# Patient Record
Sex: Female | Born: 1955 | Race: White | Hispanic: No | Marital: Married | Smoking: Former smoker
Health system: Southern US, Community
[De-identification: ages and names within clinical notes are randomized; demographics above are authoritative.]

## PROBLEM LIST (undated history)

## (undated) DIAGNOSIS — F419 Anxiety disorder, unspecified: Secondary | ICD-10-CM

## (undated) DIAGNOSIS — E559 Vitamin D deficiency, unspecified: Secondary | ICD-10-CM

## (undated) DIAGNOSIS — G4730 Sleep apnea, unspecified: Secondary | ICD-10-CM

## (undated) DIAGNOSIS — R002 Palpitations: Secondary | ICD-10-CM

## (undated) DIAGNOSIS — M2550 Pain in unspecified joint: Secondary | ICD-10-CM

## (undated) DIAGNOSIS — K829 Disease of gallbladder, unspecified: Secondary | ICD-10-CM

## (undated) DIAGNOSIS — E785 Hyperlipidemia, unspecified: Secondary | ICD-10-CM

## (undated) DIAGNOSIS — K219 Gastro-esophageal reflux disease without esophagitis: Secondary | ICD-10-CM

## (undated) DIAGNOSIS — Z923 Personal history of irradiation: Secondary | ICD-10-CM

## (undated) DIAGNOSIS — C50919 Malignant neoplasm of unspecified site of unspecified female breast: Secondary | ICD-10-CM

## (undated) DIAGNOSIS — Z8739 Personal history of other diseases of the musculoskeletal system and connective tissue: Secondary | ICD-10-CM

## (undated) DIAGNOSIS — M549 Dorsalgia, unspecified: Secondary | ICD-10-CM

## (undated) DIAGNOSIS — F32A Depression, unspecified: Secondary | ICD-10-CM

## (undated) DIAGNOSIS — M1990 Unspecified osteoarthritis, unspecified site: Secondary | ICD-10-CM

## (undated) HISTORY — DX: Palpitations: R00.2

## (undated) HISTORY — DX: Sleep apnea, unspecified: G47.30

## (undated) HISTORY — PX: REDUCTION MAMMAPLASTY: SUR839

## (undated) HISTORY — DX: Unspecified osteoarthritis, unspecified site: M19.90

## (undated) HISTORY — DX: Vitamin D deficiency, unspecified: E55.9

## (undated) HISTORY — DX: Pain in unspecified joint: M25.50

## (undated) HISTORY — DX: Personal history of other diseases of the musculoskeletal system and connective tissue: Z87.39

## (undated) HISTORY — DX: Disease of gallbladder, unspecified: K82.9

## (undated) HISTORY — DX: Dorsalgia, unspecified: M54.9

## (undated) HISTORY — PX: BREAST BIOPSY: SHX20

## (undated) HISTORY — DX: Anxiety disorder, unspecified: F41.9

## (undated) HISTORY — DX: Hyperlipidemia, unspecified: E78.5

## (undated) HISTORY — DX: Gastro-esophageal reflux disease without esophagitis: K21.9

## (undated) HISTORY — DX: Depression, unspecified: F32.A

---

## 2002-04-21 DIAGNOSIS — C50919 Malignant neoplasm of unspecified site of unspecified female breast: Secondary | ICD-10-CM

## 2002-04-21 HISTORY — DX: Malignant neoplasm of unspecified site of unspecified female breast: C50.919

## 2002-04-21 HISTORY — PX: BREAST EXCISIONAL BIOPSY: SUR124

## 2004-12-17 ENCOUNTER — Ambulatory Visit: Payer: Self-pay | Admitting: Oncology

## 2004-12-18 ENCOUNTER — Ambulatory Visit: Payer: Self-pay | Admitting: Obstetrics and Gynecology

## 2004-12-20 ENCOUNTER — Ambulatory Visit: Payer: Self-pay | Admitting: Oncology

## 2005-06-13 ENCOUNTER — Ambulatory Visit: Payer: Self-pay | Admitting: Oncology

## 2005-06-19 ENCOUNTER — Ambulatory Visit: Payer: Self-pay | Admitting: Oncology

## 2006-01-02 ENCOUNTER — Ambulatory Visit: Payer: Self-pay | Admitting: Oncology

## 2006-01-19 ENCOUNTER — Ambulatory Visit: Payer: Self-pay | Admitting: Oncology

## 2006-02-19 ENCOUNTER — Ambulatory Visit: Payer: Self-pay | Admitting: Oncology

## 2006-07-01 ENCOUNTER — Ambulatory Visit: Payer: Self-pay | Admitting: Oncology

## 2006-07-21 ENCOUNTER — Ambulatory Visit: Payer: Self-pay | Admitting: Oncology

## 2006-07-29 ENCOUNTER — Ambulatory Visit: Payer: Self-pay | Admitting: Oncology

## 2006-08-20 ENCOUNTER — Ambulatory Visit: Payer: Self-pay | Admitting: Oncology

## 2007-01-20 ENCOUNTER — Ambulatory Visit: Payer: Self-pay | Admitting: Oncology

## 2007-02-04 ENCOUNTER — Ambulatory Visit: Payer: Self-pay | Admitting: Oncology

## 2007-02-20 ENCOUNTER — Ambulatory Visit: Payer: Self-pay | Admitting: Oncology

## 2007-07-21 ENCOUNTER — Ambulatory Visit: Payer: Self-pay | Admitting: Oncology

## 2007-08-03 ENCOUNTER — Ambulatory Visit: Payer: Self-pay | Admitting: Oncology

## 2007-08-09 ENCOUNTER — Ambulatory Visit: Payer: Self-pay | Admitting: Oncology

## 2007-08-20 ENCOUNTER — Ambulatory Visit: Payer: Self-pay | Admitting: Oncology

## 2008-01-20 ENCOUNTER — Ambulatory Visit: Payer: Self-pay | Admitting: Oncology

## 2008-01-25 ENCOUNTER — Ambulatory Visit: Payer: Self-pay | Admitting: Oncology

## 2008-02-20 ENCOUNTER — Ambulatory Visit: Payer: Self-pay | Admitting: Oncology

## 2008-07-20 ENCOUNTER — Ambulatory Visit: Payer: Self-pay | Admitting: Oncology

## 2008-08-08 ENCOUNTER — Ambulatory Visit: Payer: Self-pay | Admitting: Oncology

## 2008-08-09 ENCOUNTER — Ambulatory Visit: Payer: Self-pay | Admitting: Oncology

## 2008-08-19 ENCOUNTER — Ambulatory Visit: Payer: Self-pay | Admitting: Oncology

## 2009-08-01 DIAGNOSIS — C4491 Basal cell carcinoma of skin, unspecified: Secondary | ICD-10-CM

## 2009-08-01 HISTORY — DX: Basal cell carcinoma of skin, unspecified: C44.91

## 2009-08-13 ENCOUNTER — Emergency Department: Payer: Self-pay | Admitting: Emergency Medicine

## 2009-09-19 ENCOUNTER — Ambulatory Visit: Payer: Self-pay | Admitting: Oncology

## 2009-09-24 ENCOUNTER — Ambulatory Visit: Payer: Self-pay | Admitting: Oncology

## 2009-10-01 ENCOUNTER — Ambulatory Visit: Payer: Self-pay | Admitting: Oncology

## 2009-10-19 ENCOUNTER — Ambulatory Visit: Payer: Self-pay | Admitting: Oncology

## 2010-09-26 ENCOUNTER — Ambulatory Visit: Payer: Self-pay | Admitting: Oncology

## 2010-10-14 ENCOUNTER — Ambulatory Visit: Payer: Self-pay | Admitting: Oncology

## 2010-10-20 ENCOUNTER — Ambulatory Visit: Payer: Self-pay | Admitting: Oncology

## 2011-09-29 ENCOUNTER — Ambulatory Visit: Payer: Self-pay | Admitting: Oncology

## 2011-11-27 ENCOUNTER — Ambulatory Visit: Payer: Self-pay | Admitting: Oncology

## 2011-12-21 ENCOUNTER — Ambulatory Visit: Payer: Self-pay | Admitting: Oncology

## 2012-05-20 ENCOUNTER — Ambulatory Visit: Payer: Self-pay | Admitting: Gastroenterology

## 2012-05-26 ENCOUNTER — Ambulatory Visit: Payer: Self-pay

## 2012-06-23 ENCOUNTER — Ambulatory Visit: Payer: Self-pay | Admitting: Surgery

## 2012-06-23 LAB — CBC WITH DIFFERENTIAL/PLATELET
Basophil %: 0.8 %
HCT: 40.7 % (ref 35.0–47.0)
Lymphocyte %: 35.7 %
MCH: 31.6 pg (ref 26.0–34.0)
MCV: 95 fL (ref 80–100)
Monocyte #: 0.5 x10 3/mm (ref 0.2–0.9)
Monocyte %: 8.2 %
Neutrophil %: 53.5 %
Platelet: 231 10*3/uL (ref 150–440)
RBC: 4.28 10*6/uL (ref 3.80–5.20)
WBC: 5.7 10*3/uL (ref 3.6–11.0)

## 2012-06-23 LAB — HEPATIC FUNCTION PANEL A (ARMC)
Albumin: 3.6 g/dL (ref 3.4–5.0)
Alkaline Phosphatase: 86 U/L (ref 50–136)
Bilirubin,Total: 0.4 mg/dL (ref 0.2–1.0)
Total Protein: 7.2 g/dL (ref 6.4–8.2)

## 2012-06-30 ENCOUNTER — Ambulatory Visit: Payer: Self-pay | Admitting: Surgery

## 2012-09-29 ENCOUNTER — Ambulatory Visit: Payer: Self-pay | Admitting: Oncology

## 2012-11-19 ENCOUNTER — Ambulatory Visit: Payer: Self-pay | Admitting: Oncology

## 2012-12-20 ENCOUNTER — Ambulatory Visit: Payer: Self-pay | Admitting: Oncology

## 2013-10-13 ENCOUNTER — Ambulatory Visit: Payer: Self-pay | Admitting: Oncology

## 2013-12-07 DIAGNOSIS — M3390 Dermatopolymyositis, unspecified, organ involvement unspecified: Secondary | ICD-10-CM | POA: Insufficient documentation

## 2014-08-04 ENCOUNTER — Other Ambulatory Visit: Payer: Self-pay | Admitting: Certified Nurse Midwife

## 2014-08-04 DIAGNOSIS — Z1231 Encounter for screening mammogram for malignant neoplasm of breast: Secondary | ICD-10-CM

## 2014-08-11 NOTE — Op Note (Signed)
PATIENT NAME:  Ann Jordan, KOFFLER MR#:  [id number] DATE OF BIRTH:  August 06, 1955  DATE OF PROCEDURE:  06/30/2012  PREOPERATIVE DIAGNOSIS: Symptomatic cholelithiasis.   POSTOPERATIVE DIAGNOSIS: Symptomatic cholelithiasis.   PROCEDURE PERFORMED: Laparoscopic cholecystectomy.   ATTENDING SURGEON: Hortencia Conradi, MD. FACS  ASSISTANT: Surgical scrub technologist.   TYPE OF ANESTHESIA: General oral endotracheal.   FINDINGS: Three large stones, one of which was given to the patient. There was a large gallbladder with chronic inflammatory changes.   SPECIMENS: Gallbladder with contents.   ESTIMATED BLOOD LOSS: 50 mL.   DRAINS: None.   LAP AND NEEDLE COUNT: Correct x 2.   DESCRIPTION OF PROCEDURE: With the patient in the supine position, general oral endotracheal anesthesia was induced. Her abdomen was widely prepped and draped with ChloraPrep solution. The left arm was padded and tucked at her side. Timeout was observed.   A 12 mm blunt Hassan trocar was placed through an open technique through an infraumbilical transversely oriented skin incision with stay sutures being passed through the fascia. Pneumoperitoneum was established. The patient was then positioned in reverse Trendelenburg and airplaned right side up. A 5 mm bladeless trocar was placed in the epigastric region. Two 5 mm ports were then placed in the right subcostal margin. The gallbladder was grasped along its fundus and elevated over the right lobe of the liver. Lateral traction was placed on Hartman's pouch. Adhesions of the omentum were taken down with blunt technique and point cautery for hemostasis. Lateral traction was placed on Hartman's pouch. The hepatoduodenal ligament was then exposed incising the peritoneum overlying the structures. Cystic duct and cystic artery were critically identified. A thickened peritoneal band anterior to the cystic duct was divided between single hemoclips. With a critical view of safety cholecystectomy  being performed, the cystic duct was triply clipped on the portal side, singly clipped on the gallbladder side and divided.   Of note, the cystic artery appeared to be in intimate association with the cystic duct located on its anterior surface and these 2 were able to be separated with ease. The cystic artery was triply clipped on the portal side, singly clipped on the gallbladder side and divided. Further dissection within this space demonstrated no evidence of an aberrant artery or bile duct. The gallbladder was then taken off the liver bed utilizing a combination of traction, countertraction and hook electrocautery device. It was placed into an Endo Catch device and retrieved. Due to the large nature of the stones, which could not be crushed, the infraumbilical fascial defect was extended approximately 1.5 cm inferiorly. The skin incision was made slightly wider. The specimen was able to be delivered. With pneumoperitoneum re-established, the right upper quadrant was inspected for hemostasis and irrigated with 1 L of normal saline irrigation. Hemostasis was obtained with point cautery. Surgiflo (10 mL) with thrombin application was applied to the gallbladder fossa. With hemostasis being ensured on the operative field, no evidence of bile leak or bowel injury, ports were then removed under direct visualization. The infraumbilical fascial defect was reapproximated with multiple interrupted simple and vertical #0 Vicryl sutures. Total of 30 mL of 0.25% plain Marcaine was infiltrated along all skin and fascial incisions prior to closure. The infraumbilical subcutaneous space was reapproximated with interrupted 3-0 Vicryl sutures. 4-0 Vicryl subcuticular was applied to the skin edges followed by the application of benzoin, Steri-Strips, Telfa, and Tegaderm. The patient was then subsequently extubated and taken to the recovery room in stable and satisfactory condition by anesthesia services.  ____________________________ Jeannette How Marina Gravel, MD FACS mab:aw D: 07/01/2012 10:50:58 ET T: 07/01/2012 11:02:22 ET JOB#: [id number]  cc: Elta Gu[place] A. Marina Gravel, MD, <Dictator> Adin Hector, MD MARK Bettina Gavia MD ELECTRONICALLY SIGNED 07/06/2012 14:21

## 2014-10-02 ENCOUNTER — Ambulatory Visit
Admission: RE | Admit: 2014-10-02 | Discharge: 2014-10-02 | Disposition: A | Discharge status: Home / Self Care | Payer: Managed Care, Other (non HMO) | Source: Ambulatory Visit | Attending: Certified Nurse Midwife | Admitting: Certified Nurse Midwife

## 2014-10-02 DIAGNOSIS — Z1231 Encounter for screening mammogram for malignant neoplasm of breast: Secondary | ICD-10-CM | POA: Insufficient documentation

## 2014-10-02 HISTORY — DX: Malignant neoplasm of unspecified site of unspecified female breast: C50.919

## 2015-02-15 DIAGNOSIS — R002 Palpitations: Secondary | ICD-10-CM | POA: Insufficient documentation

## 2015-05-02 ENCOUNTER — Other Ambulatory Visit: Payer: Self-pay | Admitting: Obstetrics and Gynecology

## 2015-05-02 DIAGNOSIS — Z1231 Encounter for screening mammogram for malignant neoplasm of breast: Secondary | ICD-10-CM

## 2015-08-20 DIAGNOSIS — Z853 Personal history of malignant neoplasm of breast: Secondary | ICD-10-CM | POA: Insufficient documentation

## 2015-10-03 ENCOUNTER — Ambulatory Visit
Admission: RE | Admit: 2015-10-03 | Discharge: 2015-10-03 | Disposition: A | Discharge status: Home / Self Care | Payer: Managed Care, Other (non HMO) | Source: Ambulatory Visit | Attending: Obstetrics and Gynecology | Admitting: Obstetrics and Gynecology

## 2015-10-03 ENCOUNTER — Other Ambulatory Visit: Payer: Self-pay | Admitting: Obstetrics and Gynecology

## 2015-10-03 DIAGNOSIS — Z1231 Encounter for screening mammogram for malignant neoplasm of breast: Secondary | ICD-10-CM | POA: Insufficient documentation

## 2016-02-20 DIAGNOSIS — F3342 Major depressive disorder, recurrent, in full remission: Secondary | ICD-10-CM | POA: Insufficient documentation

## 2016-05-06 ENCOUNTER — Other Ambulatory Visit: Payer: Self-pay | Admitting: Obstetrics and Gynecology

## 2016-05-06 DIAGNOSIS — Z1231 Encounter for screening mammogram for malignant neoplasm of breast: Secondary | ICD-10-CM

## 2016-10-06 ENCOUNTER — Ambulatory Visit: Payer: Managed Care, Other (non HMO)

## 2016-10-07 ENCOUNTER — Ambulatory Visit
Admission: RE | Admit: 2016-10-07 | Discharge: 2016-10-07 | Disposition: A | Discharge status: Home / Self Care | Payer: Managed Care, Other (non HMO) | Source: Ambulatory Visit | Attending: Obstetrics and Gynecology | Admitting: Obstetrics and Gynecology

## 2016-10-07 ENCOUNTER — Other Ambulatory Visit: Payer: Self-pay | Admitting: Obstetrics and Gynecology

## 2016-10-07 DIAGNOSIS — Z1231 Encounter for screening mammogram for malignant neoplasm of breast: Secondary | ICD-10-CM | POA: Diagnosis not present

## 2017-05-07 ENCOUNTER — Other Ambulatory Visit: Payer: Self-pay | Admitting: Obstetrics and Gynecology

## 2017-05-07 DIAGNOSIS — Z1231 Encounter for screening mammogram for malignant neoplasm of breast: Secondary | ICD-10-CM

## 2017-10-09 ENCOUNTER — Ambulatory Visit: Payer: Managed Care, Other (non HMO)

## 2017-10-23 ENCOUNTER — Ambulatory Visit
Admission: RE | Admit: 2017-10-23 | Discharge: 2017-10-23 | Disposition: A | Discharge status: Home / Self Care | Payer: Managed Care, Other (non HMO) | Source: Ambulatory Visit | Attending: Obstetrics and Gynecology | Admitting: Obstetrics and Gynecology

## 2017-10-23 DIAGNOSIS — Z1231 Encounter for screening mammogram for malignant neoplasm of breast: Secondary | ICD-10-CM

## 2018-03-05 DIAGNOSIS — H02886 Meibomian gland dysfunction of left eye, unspecified eyelid: Secondary | ICD-10-CM | POA: Insufficient documentation

## 2018-03-05 DIAGNOSIS — H2513 Age-related nuclear cataract, bilateral: Secondary | ICD-10-CM | POA: Insufficient documentation

## 2018-03-05 DIAGNOSIS — H02883 Meibomian gland dysfunction of right eye, unspecified eyelid: Secondary | ICD-10-CM | POA: Insufficient documentation

## 2018-05-20 ENCOUNTER — Other Ambulatory Visit: Payer: Self-pay | Admitting: Obstetrics and Gynecology

## 2018-05-20 DIAGNOSIS — Z1231 Encounter for screening mammogram for malignant neoplasm of breast: Secondary | ICD-10-CM

## 2018-08-27 DIAGNOSIS — E7849 Other hyperlipidemia: Secondary | ICD-10-CM | POA: Diagnosis not present

## 2018-08-27 DIAGNOSIS — M069 Rheumatoid arthritis, unspecified: Secondary | ICD-10-CM | POA: Diagnosis not present

## 2018-08-27 DIAGNOSIS — M3390 Dermatopolymyositis, unspecified, organ involvement unspecified: Secondary | ICD-10-CM | POA: Diagnosis not present

## 2018-09-03 DIAGNOSIS — E7849 Other hyperlipidemia: Secondary | ICD-10-CM | POA: Diagnosis not present

## 2018-09-03 DIAGNOSIS — R69 Illness, unspecified: Secondary | ICD-10-CM | POA: Diagnosis not present

## 2018-09-03 DIAGNOSIS — Z0000 Encounter for general adult medical examination without abnormal findings: Secondary | ICD-10-CM | POA: Diagnosis not present

## 2018-09-03 DIAGNOSIS — R002 Palpitations: Secondary | ICD-10-CM | POA: Diagnosis not present

## 2018-11-17 ENCOUNTER — Other Ambulatory Visit: Payer: Self-pay

## 2018-11-17 ENCOUNTER — Ambulatory Visit
Admission: RE | Admit: 2018-11-17 | Discharge: 2018-11-17 | Disposition: A | Discharge status: Home / Self Care | Payer: PPO | Source: Ambulatory Visit | Attending: Obstetrics and Gynecology | Admitting: Obstetrics and Gynecology

## 2018-11-17 DIAGNOSIS — Z1231 Encounter for screening mammogram for malignant neoplasm of breast: Secondary | ICD-10-CM | POA: Diagnosis not present

## 2019-03-04 DIAGNOSIS — Z791 Long term (current) use of non-steroidal anti-inflammatories (NSAID): Secondary | ICD-10-CM | POA: Diagnosis not present

## 2019-03-04 DIAGNOSIS — E7849 Other hyperlipidemia: Secondary | ICD-10-CM | POA: Diagnosis not present

## 2019-03-04 DIAGNOSIS — M3390 Dermatopolymyositis, unspecified, organ involvement unspecified: Secondary | ICD-10-CM | POA: Diagnosis not present

## 2019-03-04 DIAGNOSIS — M069 Rheumatoid arthritis, unspecified: Secondary | ICD-10-CM | POA: Diagnosis not present

## 2019-03-11 DIAGNOSIS — Z0000 Encounter for general adult medical examination without abnormal findings: Secondary | ICD-10-CM | POA: Diagnosis not present

## 2019-03-11 DIAGNOSIS — Z780 Asymptomatic menopausal state: Secondary | ICD-10-CM | POA: Diagnosis not present

## 2019-03-11 DIAGNOSIS — M3390 Dermatopolymyositis, unspecified, organ involvement unspecified: Secondary | ICD-10-CM | POA: Diagnosis not present

## 2019-03-11 DIAGNOSIS — M0580 Other rheumatoid arthritis with rheumatoid factor of unspecified site: Secondary | ICD-10-CM | POA: Diagnosis not present

## 2019-03-11 DIAGNOSIS — F3342 Major depressive disorder, recurrent, in full remission: Secondary | ICD-10-CM | POA: Diagnosis not present

## 2019-03-11 DIAGNOSIS — G4733 Obstructive sleep apnea (adult) (pediatric): Secondary | ICD-10-CM | POA: Diagnosis not present

## 2019-03-11 DIAGNOSIS — R002 Palpitations: Secondary | ICD-10-CM | POA: Diagnosis not present

## 2019-03-11 DIAGNOSIS — E7849 Other hyperlipidemia: Secondary | ICD-10-CM | POA: Diagnosis not present

## 2019-03-11 DIAGNOSIS — M8588 Other specified disorders of bone density and structure, other site: Secondary | ICD-10-CM | POA: Diagnosis not present

## 2019-03-11 DIAGNOSIS — K219 Gastro-esophageal reflux disease without esophagitis: Secondary | ICD-10-CM | POA: Diagnosis not present

## 2019-05-25 ENCOUNTER — Other Ambulatory Visit: Payer: Self-pay | Admitting: Obstetrics and Gynecology

## 2019-05-25 DIAGNOSIS — R635 Abnormal weight gain: Secondary | ICD-10-CM | POA: Diagnosis not present

## 2019-05-25 DIAGNOSIS — Z1231 Encounter for screening mammogram for malignant neoplasm of breast: Secondary | ICD-10-CM | POA: Diagnosis not present

## 2019-05-25 DIAGNOSIS — Z124 Encounter for screening for malignant neoplasm of cervix: Secondary | ICD-10-CM | POA: Diagnosis not present

## 2019-09-01 DIAGNOSIS — E7849 Other hyperlipidemia: Secondary | ICD-10-CM | POA: Diagnosis not present

## 2019-09-01 DIAGNOSIS — K219 Gastro-esophageal reflux disease without esophagitis: Secondary | ICD-10-CM | POA: Diagnosis not present

## 2019-09-01 DIAGNOSIS — M0580 Other rheumatoid arthritis with rheumatoid factor of unspecified site: Secondary | ICD-10-CM | POA: Diagnosis not present

## 2019-09-01 DIAGNOSIS — M3390 Dermatopolymyositis, unspecified, organ involvement unspecified: Secondary | ICD-10-CM | POA: Diagnosis not present

## 2019-09-01 DIAGNOSIS — G4733 Obstructive sleep apnea (adult) (pediatric): Secondary | ICD-10-CM | POA: Diagnosis not present

## 2019-09-09 DIAGNOSIS — Z853 Personal history of malignant neoplasm of breast: Secondary | ICD-10-CM | POA: Diagnosis not present

## 2019-09-09 DIAGNOSIS — M3390 Dermatopolymyositis, unspecified, organ involvement unspecified: Secondary | ICD-10-CM | POA: Diagnosis not present

## 2019-09-09 DIAGNOSIS — M0580 Other rheumatoid arthritis with rheumatoid factor of unspecified site: Secondary | ICD-10-CM | POA: Diagnosis not present

## 2019-09-09 DIAGNOSIS — K219 Gastro-esophageal reflux disease without esophagitis: Secondary | ICD-10-CM | POA: Diagnosis not present

## 2019-09-09 DIAGNOSIS — G4733 Obstructive sleep apnea (adult) (pediatric): Secondary | ICD-10-CM | POA: Diagnosis not present

## 2019-09-09 DIAGNOSIS — F3342 Major depressive disorder, recurrent, in full remission: Secondary | ICD-10-CM | POA: Diagnosis not present

## 2019-09-09 DIAGNOSIS — R002 Palpitations: Secondary | ICD-10-CM | POA: Diagnosis not present

## 2019-09-09 DIAGNOSIS — E7849 Other hyperlipidemia: Secondary | ICD-10-CM | POA: Diagnosis not present

## 2019-09-09 DIAGNOSIS — Z0000 Encounter for general adult medical examination without abnormal findings: Secondary | ICD-10-CM | POA: Diagnosis not present

## 2019-11-18 ENCOUNTER — Ambulatory Visit
Admission: RE | Admit: 2019-11-18 | Discharge: 2019-11-18 | Disposition: A | Discharge status: Home / Self Care | Payer: PPO | Source: Ambulatory Visit | Attending: Obstetrics and Gynecology | Admitting: Obstetrics and Gynecology

## 2019-11-18 ENCOUNTER — Other Ambulatory Visit: Payer: Self-pay

## 2019-11-18 DIAGNOSIS — Z1231 Encounter for screening mammogram for malignant neoplasm of breast: Secondary | ICD-10-CM | POA: Diagnosis not present

## 2019-11-18 HISTORY — DX: Personal history of irradiation: Z92.3

## 2019-12-12 DIAGNOSIS — G4733 Obstructive sleep apnea (adult) (pediatric): Secondary | ICD-10-CM | POA: Diagnosis not present

## 2019-12-29 DIAGNOSIS — G4733 Obstructive sleep apnea (adult) (pediatric): Secondary | ICD-10-CM | POA: Diagnosis not present

## 2020-01-12 DIAGNOSIS — G4733 Obstructive sleep apnea (adult) (pediatric): Secondary | ICD-10-CM | POA: Diagnosis not present

## 2020-02-11 DIAGNOSIS — G4733 Obstructive sleep apnea (adult) (pediatric): Secondary | ICD-10-CM | POA: Diagnosis not present

## 2020-03-05 DIAGNOSIS — K219 Gastro-esophageal reflux disease without esophagitis: Secondary | ICD-10-CM | POA: Diagnosis not present

## 2020-03-05 DIAGNOSIS — M3390 Dermatopolymyositis, unspecified, organ involvement unspecified: Secondary | ICD-10-CM | POA: Diagnosis not present

## 2020-03-05 DIAGNOSIS — E7849 Other hyperlipidemia: Secondary | ICD-10-CM | POA: Diagnosis not present

## 2020-03-05 DIAGNOSIS — M0580 Other rheumatoid arthritis with rheumatoid factor of unspecified site: Secondary | ICD-10-CM | POA: Diagnosis not present

## 2020-03-05 DIAGNOSIS — G4733 Obstructive sleep apnea (adult) (pediatric): Secondary | ICD-10-CM | POA: Diagnosis not present

## 2020-03-06 DIAGNOSIS — F3342 Major depressive disorder, recurrent, in full remission: Secondary | ICD-10-CM | POA: Diagnosis not present

## 2020-03-06 DIAGNOSIS — K219 Gastro-esophageal reflux disease without esophagitis: Secondary | ICD-10-CM | POA: Diagnosis not present

## 2020-03-06 DIAGNOSIS — E7849 Other hyperlipidemia: Secondary | ICD-10-CM | POA: Diagnosis not present

## 2020-03-06 DIAGNOSIS — G4733 Obstructive sleep apnea (adult) (pediatric): Secondary | ICD-10-CM | POA: Diagnosis not present

## 2020-03-06 DIAGNOSIS — M0580 Other rheumatoid arthritis with rheumatoid factor of unspecified site: Secondary | ICD-10-CM | POA: Diagnosis not present

## 2020-03-06 DIAGNOSIS — M3390 Dermatopolymyositis, unspecified, organ involvement unspecified: Secondary | ICD-10-CM | POA: Diagnosis not present

## 2020-03-13 DIAGNOSIS — G4733 Obstructive sleep apnea (adult) (pediatric): Secondary | ICD-10-CM | POA: Diagnosis not present

## 2020-03-26 DIAGNOSIS — E7849 Other hyperlipidemia: Secondary | ICD-10-CM | POA: Diagnosis not present

## 2020-04-12 DIAGNOSIS — G4733 Obstructive sleep apnea (adult) (pediatric): Secondary | ICD-10-CM | POA: Diagnosis not present

## 2020-05-13 DIAGNOSIS — G4733 Obstructive sleep apnea (adult) (pediatric): Secondary | ICD-10-CM | POA: Diagnosis not present

## 2020-05-30 DIAGNOSIS — Z124 Encounter for screening for malignant neoplasm of cervix: Secondary | ICD-10-CM | POA: Diagnosis not present

## 2020-05-30 DIAGNOSIS — Z01419 Encounter for gynecological examination (general) (routine) without abnormal findings: Secondary | ICD-10-CM | POA: Diagnosis not present

## 2020-05-30 DIAGNOSIS — Z1231 Encounter for screening mammogram for malignant neoplasm of breast: Secondary | ICD-10-CM | POA: Diagnosis not present

## 2020-05-31 ENCOUNTER — Other Ambulatory Visit: Payer: Self-pay | Admitting: Obstetrics and Gynecology

## 2020-05-31 DIAGNOSIS — Z1231 Encounter for screening mammogram for malignant neoplasm of breast: Secondary | ICD-10-CM

## 2020-06-06 ENCOUNTER — Ambulatory Visit (INDEPENDENT_AMBULATORY_CARE_PROVIDER_SITE_OTHER): Payer: PPO | Admitting: Bariatrics

## 2020-06-06 ENCOUNTER — Encounter (INDEPENDENT_AMBULATORY_CARE_PROVIDER_SITE_OTHER): Payer: Self-pay | Admitting: Bariatrics

## 2020-06-06 ENCOUNTER — Other Ambulatory Visit: Payer: Self-pay

## 2020-06-06 VITALS — BP 130/82 | HR 73 | Temp 98.2°F | Ht 63.0 in | Wt 205.0 lb

## 2020-06-06 DIAGNOSIS — E559 Vitamin D deficiency, unspecified: Secondary | ICD-10-CM | POA: Diagnosis not present

## 2020-06-06 DIAGNOSIS — R5383 Other fatigue: Secondary | ICD-10-CM

## 2020-06-06 DIAGNOSIS — K219 Gastro-esophageal reflux disease without esophagitis: Secondary | ICD-10-CM | POA: Diagnosis not present

## 2020-06-06 DIAGNOSIS — Z6832 Body mass index (BMI) 32.0-32.9, adult: Secondary | ICD-10-CM | POA: Insufficient documentation

## 2020-06-06 DIAGNOSIS — G4730 Sleep apnea, unspecified: Secondary | ICD-10-CM | POA: Insufficient documentation

## 2020-06-06 DIAGNOSIS — R7309 Other abnormal glucose: Secondary | ICD-10-CM | POA: Diagnosis not present

## 2020-06-06 DIAGNOSIS — E6609 Other obesity due to excess calories: Secondary | ICD-10-CM | POA: Insufficient documentation

## 2020-06-06 DIAGNOSIS — Z1331 Encounter for screening for depression: Secondary | ICD-10-CM

## 2020-06-06 DIAGNOSIS — G4733 Obstructive sleep apnea (adult) (pediatric): Secondary | ICD-10-CM | POA: Diagnosis not present

## 2020-06-06 DIAGNOSIS — Z0289 Encounter for other administrative examinations: Secondary | ICD-10-CM

## 2020-06-06 DIAGNOSIS — E785 Hyperlipidemia, unspecified: Secondary | ICD-10-CM | POA: Insufficient documentation

## 2020-06-06 DIAGNOSIS — E7849 Other hyperlipidemia: Secondary | ICD-10-CM | POA: Diagnosis not present

## 2020-06-06 DIAGNOSIS — M0580 Other rheumatoid arthritis with rheumatoid factor of unspecified site: Secondary | ICD-10-CM | POA: Insufficient documentation

## 2020-06-06 DIAGNOSIS — Z6836 Body mass index (BMI) 36.0-36.9, adult: Secondary | ICD-10-CM | POA: Diagnosis not present

## 2020-06-06 DIAGNOSIS — E66811 Obesity, class 1: Secondary | ICD-10-CM | POA: Insufficient documentation

## 2020-06-06 DIAGNOSIS — R0602 Shortness of breath: Secondary | ICD-10-CM | POA: Diagnosis not present

## 2020-06-06 DIAGNOSIS — E66812 Obesity, class 2: Secondary | ICD-10-CM

## 2020-06-07 LAB — INSULIN, RANDOM: INSULIN: 8.2 u[IU]/mL (ref 2.6–24.9)

## 2020-06-07 LAB — HEMOGLOBIN A1C
Est. average glucose Bld gHb Est-mCnc: 105 mg/dL
Hgb A1c MFr Bld: 5.3 % (ref 4.8–5.6)

## 2020-06-07 LAB — VITAMIN D 25 HYDROXY (VIT D DEFICIENCY, FRACTURES): Vit D, 25-Hydroxy: 34.2 ng/mL (ref 30.0–100.0)

## 2020-06-07 NOTE — Progress Notes (Signed)
Chief Complaint:   Ann Jordan (MR# [id number]) is a 65 y.o. female who presents for evaluation and treatment of obesity and related comorbidities. Current BMI is Body mass index is 36.31 kg/m. Ann Jordan has been struggling with her weight for many years and has been unsuccessful in either losing weight, maintaining weight loss, or reaching her healthy weight goal.  Ann Jordan is currently in the action stage of change and ready to dedicate time achieving and maintaining a healthier weight. Ann Jordan is interested in becoming our patient and working on intensive lifestyle modifications including (but not limited to) diet and exercise for weight loss.  Ann Jordan is lactose intolerant. She like to cook and denies obstacles. She craves sweets.  Ann Jordan's habits were reviewed today and are as follows: Her family eats meals together, she thinks her family will eat healthier with her, her desired weight loss is 45 lbs, she has been heavy most of her life, she started gaining weight as a child, her heaviest weight ever was 229 pounds, she has significant food cravings issues, she snacks frequently in the evenings, she skips meals frequently, she is frequently drinking liquids with calories, she frequently makes poor food choices, she frequently eats larger portions than normal, she has binge eating behaviors and she struggles with emotional eating.  Depression Screen Ann Jordan's Food and Mood (modified PHQ-9) score was 15.  Depression screen PHQ 2/9 06/06/2020  Decreased Interest 1  Down, Depressed, Hopeless 3  PHQ - 2 Score 4  Altered sleeping 2  Tired, decreased energy 3  Change in appetite 3  Feeling bad or failure about yourself  3  Trouble concentrating 0  Moving slowly or fidgety/restless 0  Suicidal thoughts 0  PHQ-9 Score 15  Difficult doing work/chores Not difficult at all   Subjective:   1. Other fatigue Rebecka admits to daytime somnolence and denies waking up still tired. Patent has a  history of symptoms of OSA. Ann Jordan generally gets 8 or 9 hours of sleep per night, and states that she has generally restful sleep. Snoring is present without CPAP. Apneic episodes are present without CPAP. Epworth Sleepiness Score is 3.  2. SOB (shortness of breath) on exertion Ann Jordan notes increasing shortness of breath with exercising and seems to be worsening over time with weight gain. She notes getting out of breath sooner with activity than she used to. This has gotten worse recently. Ann Jordan denies shortness of breath at rest or orthopnea.  3. Other hyperlipidemia Ann Jordan has a history of hyperlipidemia. She is taking fish oil.  4. Elevated glucose Ann Jordan has a history of some elevated blood glucose readings without a diagnosis of diabetes. She also has a paternal family history.  5. Vitamin D deficiency Ann Jordan has a history of Vit D deficiency. She is not on Vit D supplementation.  6. Gastroesophageal reflux disease without esophagitis Ann Jordan has a history of GERD and is taking omeprazole.  7. OSA (obstructive sleep apnea) Ann Jordan has a diagnosis of sleep apnea. She reports that she is using a CPAP regularly.   Assessment/Plan:   1. Other fatigue Ann Jordan does feel that her weight is causing her energy to be lower than it should be. Fatigue may be related to obesity, depression or many other causes. Labs will be ordered, and in the meanwhile, Audrionna will focus on self care including making healthy food choices, increasing physical activity and focusing on stress reduction. - EKG 12-Lead  2. SOB (shortness of breath) on exertion Ann Jordan  does feel that she gets out of breath more easily that she used to when she exercises. Ann Jordan's shortness of breath appears to be obesity related and exercise induced. She has agreed to work on weight loss and gradually increase exercise to treat her exercise induced shortness of breath. Will continue to monitor closely.  3. Other hyperlipidemia Cardiovascular  risk and specific lipid/LDL goals reviewed.  We discussed several lifestyle modifications today and Joshalyn will continue to work on diet, exercise and weight loss efforts. Orders and follow up as documented in patient record.   Counseling Intensive lifestyle modifications are the first line treatment for this issue.  Dietary changes: Increase soluble fiber. Decrease simple carbohydrates.  Exercise changes: Moderate to vigorous-intensity aerobic activity 150 minutes per week if tolerated.  Lipid-lowering medications: see documented in medical record.  4. Elevated glucose Fasting labs will be obtained and results with be discussed with Greg in 2 weeks at her follow up visit. In the meanwhile Ann Jordan was started on a lower simple carbohydrate diet and will work on weight loss efforts.  - Hemoglobin A1c - Insulin, random  5. Vitamin D deficiency Low Vitamin D level contributes to fatigue and are associated with obesity, breast, and colon cancer. She agrees to follow-up for routine testing of Vitamin D, at least 2-3 times per year to avoid over-replacement. - VITAMIN D 25 Hydroxy (Vit-D Deficiency, Fractures)  6. Gastroesophageal reflux disease without esophagitis Intensive lifestyle modifications are the first line treatment for this issue. We discussed several lifestyle modifications today and she will continue to work on diet, exercise and weight loss efforts. Orders and follow up as documented in patient record.   Counseling  If a person has gastroesophageal reflux disease (GERD), food and stomach acid move back up into the esophagus and cause symptoms or problems such as damage to the esophagus.  Anti-reflux measures include: raising the head of the bed, avoiding tight clothing or belts, avoiding eating late at night, not lying down shortly after mealtime, and achieving weight loss.  Avoid ASA, NSAID's, caffeine, alcohol, and tobacco.   OTC Pepcid and/or Tums are often very helpful for  as needed use.   However, for persisting chronic or daily symptoms, stronger medications like Omeprazole may be needed.  You may need to avoid foods and drinks such as: ? Coffee and tea (with or without caffeine). ? Drinks that contain alcohol. ? Energy drinks and sports drinks. ? Bubbly (carbonated) drinks or sodas. ? Chocolate and cocoa. ? Peppermint and mint flavorings. ? Garlic and onions. ? Horseradish. ? Spicy and acidic foods. These include peppers, chili powder, curry powder, vinegar, Ann sauces, and BBQ sauce. ? Citrus fruit juices and citrus fruits, such as oranges, lemons, and limes. ? Tomato-based foods. These include red sauce, chili, salsa, and pizza with red sauce. ? Fried and fatty foods. These include donuts, french fries, potato chips, and high-fat dressings. ? High-fat meats. These include Ann dogs, rib eye steak, sausage, ham, and bacon.  7. OSA (obstructive sleep apnea) Intensive lifestyle modifications are the first line treatment for this issue. We discussed several lifestyle modifications today and she will continue to work on diet, exercise and weight loss efforts. We will continue to monitor. Orders and follow up as documented in patient record. Continue CPAP use.  8. Depression screening Ann Jordan had a positive depression screening. Depression is commonly associated with obesity and often results in emotional eating behaviors. We will monitor this closely and work on CBT to help improve the  non-hunger eating patterns. Referral to Psychology may be required if no improvement is seen as she continues in our clinic.  9. Class 2 severe obesity due to excess calories with serious comorbidity and body mass index (BMI) of 36.0 to 36.9 in adult [facility]) Ann Jordan is currently in the action stage of change and her goal is to continue with weight loss efforts. I recommend Ann Jordan begin the structured treatment plan as follows:  She has agreed to the Category 1 Plan.  Exercise  goals: No exercise has been prescribed at this time.   Behavioral modification strategies: increasing lean protein intake, decreasing simple carbohydrates, increasing vegetables, increasing water intake, decreasing eating out, no skipping meals, meal planning and cooking strategies, keeping healthy foods in the home and planning for success.  She was informed of the importance of frequent follow-up visits to maximize her success with intensive lifestyle modifications for her multiple health conditions. She was informed we would discuss her lab results at her next visit unless there is a critical issue that needs to be addressed sooner. Ann Jordan agreed to keep her next visit at the agreed upon time to discuss these results.  Objective:   Blood pressure 130/82, pulse 73, temperature 98.2 F (36.8 C), height 5\' 3"  (1.6 m), weight 205 lb (93 kg), SpO2 98 %. Body mass index is 36.31 kg/m.  EKG: Normal sinus rhythm, rate 71.  Indirect Calorimeter completed today shows a VO2 of 180 and a REE of 1254.  Her calculated basal metabolic rate is 4098 thus her basal metabolic rate is worse than expected.  General: Cooperative, alert, well developed, in no acute distress. HEENT: Conjunctivae and lids unremarkable. Cardiovascular: Regular rhythm.  Lungs: Normal work of breathing. Neurologic: No focal deficits.   No results found for: CREATININE, BUN, NA, K, CL, CO2 Lab Results  Component Value Date   ALT 36 06/23/2012   AST 23 06/23/2012   ALKPHOS 86 06/23/2012   BILITOT 0.4 06/23/2012   Lab Results  Component Value Date   HGBA1C 5.3 06/06/2020   Lab Results  Component Value Date   INSULIN 8.2 06/06/2020   No results found for: TSH No results found for: CHOL, HDL, LDLCALC, LDLDIRECT, TRIG, CHOLHDL Lab Results  Component Value Date   WBC 5.7 06/23/2012   HGB 13.5 06/23/2012   HCT 40.7 06/23/2012   MCV 95 06/23/2012   PLT 231 06/23/2012   No results found for: IRON, TIBC, FERRITIN Obesity  Behavioral Intervention:   Approximately 15 minutes were spent on the discussion below.  ASK: We discussed the diagnosis of obesity with Tarahji today and Nealy agreed to give [place] permission to discuss obesity behavioral modification therapy today.  ASSESS: Lei has the diagnosis of obesity and her BMI today is 36.4. Catharina is in the action stage of change.   ADVISE: Mkayla was educated on the multiple health risks of obesity as well as the benefit of weight loss to improve her health. She was advised of the need for long term treatment and the importance of lifestyle modifications to improve her current health and to decrease her risk of future health problems.  AGREE: Multiple dietary modification options and treatment options were discussed and Daya agreed to follow the recommendations documented in the above note.  ARRANGE: Tieisha was educated on the importance of frequent visits to treat obesity as outlined per CMS and USPSTF guidelines and agreed to schedule her next follow up appointment today.  Attestation Statements:   Reviewed by clinician on day  of visit: allergies, medications, problem list, medical history, surgical history, family history, social history, and previous encounter notes.  Coral Ceo, am acting as Location manager for [employer], DO.  I have reviewed the above documentation for accuracy and completeness, and I agree with the above. Jearld Lesch, DO

## 2020-06-10 DIAGNOSIS — R0989 Other specified symptoms and signs involving the circulatory and respiratory systems: Secondary | ICD-10-CM | POA: Diagnosis not present

## 2020-06-10 DIAGNOSIS — Z03818 Encounter for observation for suspected exposure to other biological agents ruled out: Secondary | ICD-10-CM | POA: Diagnosis not present

## 2020-06-11 ENCOUNTER — Encounter (INDEPENDENT_AMBULATORY_CARE_PROVIDER_SITE_OTHER): Payer: Self-pay | Admitting: Bariatrics

## 2020-06-13 DIAGNOSIS — G4733 Obstructive sleep apnea (adult) (pediatric): Secondary | ICD-10-CM | POA: Diagnosis not present

## 2020-06-20 ENCOUNTER — Ambulatory Visit (INDEPENDENT_AMBULATORY_CARE_PROVIDER_SITE_OTHER): Payer: PPO | Admitting: Bariatrics

## 2020-06-20 ENCOUNTER — Encounter (INDEPENDENT_AMBULATORY_CARE_PROVIDER_SITE_OTHER): Payer: Self-pay | Admitting: Bariatrics

## 2020-06-20 ENCOUNTER — Other Ambulatory Visit: Payer: Self-pay

## 2020-06-20 VITALS — BP 136/80 | HR 70 | Temp 98.2°F | Ht 63.0 in | Wt 199.0 lb

## 2020-06-20 DIAGNOSIS — Z6836 Body mass index (BMI) 36.0-36.9, adult: Secondary | ICD-10-CM | POA: Diagnosis not present

## 2020-06-20 DIAGNOSIS — E7849 Other hyperlipidemia: Secondary | ICD-10-CM | POA: Diagnosis not present

## 2020-06-20 DIAGNOSIS — E559 Vitamin D deficiency, unspecified: Secondary | ICD-10-CM

## 2020-06-20 DIAGNOSIS — E6609 Other obesity due to excess calories: Secondary | ICD-10-CM

## 2020-06-20 MED ORDER — VITAMIN D (ERGOCALCIFEROL) 1.25 MG (50000 UNIT) PO CAPS: 50000.0000 [IU] | ORAL_CAPSULE | ORAL | 0 refills | Status: DC

## 2020-06-21 ENCOUNTER — Encounter (INDEPENDENT_AMBULATORY_CARE_PROVIDER_SITE_OTHER): Payer: Self-pay | Admitting: Bariatrics

## 2020-06-21 NOTE — Progress Notes (Signed)
Chief Complaint:   OBESITY Ann Jordan is here to discuss her progress with her obesity treatment plan along with follow-up of her obesity related diagnoses. Ann Jordan is on the Category 1 Plan and states she is following her eating plan approximately 100% of the time. Ann Jordan states she is walking for 60 minutes and riding the exercise bike for 30 minutes 5 times per week.  Today's visit was #: 2 Starting weight: 205 lbs Starting date: 06/06/2020 Today's weight: 199 lbs Today's date: 06/20/2020 Total lbs lost to date: 6 Total lbs lost since last in-office visit: 6  Interim History: Ann Jordan is down 6 lbs. She states that the plan is not that hard.  Subjective:   1. Vitamin D deficiency Ann Jordan is not on Vit D, and her Vit D level is 34.2.  2. Other hyperlipidemia Ann Jordan is taking Omega 3 fatty acids.   Assessment/Plan:   1. Vitamin D deficiency Low Vitamin D level contributes to fatigue and are associated with obesity, breast, and colon cancer. Ann Jordan agreed to start prescription Vitamin D 50,000 IU every week with no refills. She will follow-up for routine testing of Vitamin D, at least 2-3 times per year to avoid over-replacement.  - Vitamin D, Ergocalciferol, (DRISDOL) 1.25 MG (50000 UNIT) CAPS capsule; Take 1 capsule (50,000 Units total) by mouth every 7 (seven) days.  Dispense: 4 capsule; Refill: 0  2. Other hyperlipidemia Cardiovascular risk and specific lipid/LDL goals reviewed. We discussed several lifestyle modifications today. Ann Jordan will continue to work on diet, exercise and weight loss efforts. No trans fats, and she is to increase PUFAs and MUFAs. Orders and follow up as documented in patient record.   Counseling Intensive lifestyle modifications are the first line treatment for this issue. . Dietary changes: Increase soluble fiber. Decrease simple carbohydrates. . Exercise changes: Moderate to vigorous-intensity aerobic activity 150 minutes per week if  tolerated. . Lipid-lowering medications: see documented in medical record.  3. Class 2 obesity due to excess calories without serious comorbidity with body mass index (BMI) of 35.0 to 35.9 in adult Ann Jordan is currently in the action stage of change. As such, her goal is to continue with weight loss efforts. She has agreed to the Category 1 Plan.   We discussed intentional eating, and I reviewed labs from 06/06/2020 with the patient today.  Exercise goals: As is.  Behavioral modification strategies: increasing lean protein intake, decreasing simple carbohydrates, increasing vegetables, increasing water intake, decreasing eating out, no skipping meals, meal planning and cooking strategies, keeping healthy foods in the home and planning for success.  Ann Jordan has agreed to follow-up with our clinic in 2 weeks. She was informed of the importance of frequent follow-up visits to maximize her success with intensive lifestyle modifications for her multiple health conditions.   Objective:   Blood pressure 136/80, pulse 70, temperature 98.2 F (36.8 C), height 5\' 3"  (1.6 m), weight 199 lb (90.3 kg), SpO2 100 %. Body mass index is 35.25 kg/m.  General: Cooperative, alert, well developed, in no acute distress. HEENT: Conjunctivae and lids unremarkable. Cardiovascular: Regular rhythm.  Lungs: Normal work of breathing. Neurologic: No focal deficits.   No results found for: CREATININE, BUN, NA, K, CL, CO2 Lab Results  Component Value Date   ALT 36 06/23/2012   AST 23 06/23/2012   ALKPHOS 86 06/23/2012   BILITOT 0.4 06/23/2012   Lab Results  Component Value Date   HGBA1C 5.3 06/06/2020   Lab Results  Component Value Date  INSULIN 8.2 06/06/2020   No results found for: TSH No results found for: CHOL, HDL, LDLCALC, LDLDIRECT, TRIG, CHOLHDL Lab Results  Component Value Date   WBC 5.7 06/23/2012   HGB 13.5 06/23/2012   HCT 40.7 06/23/2012   MCV 95 06/23/2012   PLT 231 06/23/2012   No  results found for: IRON, TIBC, FERRITIN  Obesity Behavioral Intervention:   Approximately 15 minutes were spent on the discussion below.  ASK: We discussed the diagnosis of obesity with Ann Jordan today and Ann Jordan agreed to give [place] permission to discuss obesity behavioral modification therapy today.  ASSESS: Ann Jordan has the diagnosis of obesity and her BMI today is 35.26. Ann Jordan is in the action stage of change.   ADVISE: Ann Jordan was educated on the multiple health risks of obesity as well as the benefit of weight loss to improve her health. She was advised of the need for long term treatment and the importance of lifestyle modifications to improve her current health and to decrease her risk of future health problems.  AGREE: Multiple dietary modification options and treatment options were discussed and Lauryn agreed to follow the recommendations documented in the above note.  ARRANGE: Ann Jordan was educated on the importance of frequent visits to treat obesity as outlined per CMS and USPSTF guidelines and agreed to schedule her next follow up appointment today.  Attestation Statements:   Reviewed by clinician on day of visit: allergies, medications, problem list, medical history, surgical history, family history, social history, and previous encounter notes.   Wilhemena Durie, am acting as Location manager for [employer], DO.  I have reviewed the above documentation for accuracy and completeness, and I agree with the above. Jearld Lesch, DO

## 2020-07-05 ENCOUNTER — Ambulatory Visit (INDEPENDENT_AMBULATORY_CARE_PROVIDER_SITE_OTHER): Payer: PPO | Admitting: Bariatrics

## 2020-07-05 ENCOUNTER — Other Ambulatory Visit: Payer: Self-pay

## 2020-07-05 ENCOUNTER — Encounter (INDEPENDENT_AMBULATORY_CARE_PROVIDER_SITE_OTHER): Payer: Self-pay | Admitting: Bariatrics

## 2020-07-05 VITALS — BP 121/80 | HR 79 | Temp 98.4°F | Ht 63.0 in | Wt 194.0 lb

## 2020-07-05 DIAGNOSIS — Z6834 Body mass index (BMI) 34.0-34.9, adult: Secondary | ICD-10-CM | POA: Diagnosis not present

## 2020-07-05 DIAGNOSIS — E7849 Other hyperlipidemia: Secondary | ICD-10-CM

## 2020-07-05 DIAGNOSIS — E6609 Other obesity due to excess calories: Secondary | ICD-10-CM

## 2020-07-05 DIAGNOSIS — E559 Vitamin D deficiency, unspecified: Secondary | ICD-10-CM

## 2020-07-05 MED ORDER — VITAMIN D (ERGOCALCIFEROL) 1.25 MG (50000 UNIT) PO CAPS: 50000.0000 [IU] | ORAL_CAPSULE | ORAL | 0 refills | Status: DC

## 2020-07-10 ENCOUNTER — Encounter (INDEPENDENT_AMBULATORY_CARE_PROVIDER_SITE_OTHER): Payer: Self-pay

## 2020-07-11 ENCOUNTER — Encounter (INDEPENDENT_AMBULATORY_CARE_PROVIDER_SITE_OTHER): Payer: Self-pay | Admitting: Bariatrics

## 2020-07-11 DIAGNOSIS — G4733 Obstructive sleep apnea (adult) (pediatric): Secondary | ICD-10-CM | POA: Diagnosis not present

## 2020-07-11 NOTE — Progress Notes (Signed)
Chief Complaint:   OBESITY Ann Jordan is here to discuss her progress with her obesity treatment plan along with follow-up of her obesity related diagnoses. Ann Jordan is on the Category 1 Plan and states she is following her eating plan approximately 100% of the time. Ann Jordan states she is walking for 60 minutes 5 times per week, and bike riding for 30-60 minutes 5 times per week.  Today's visit was #: 3 Starting weight: 205 lbs Starting date: 06/06/2020 Today's weight: 194 lbs Today's date: 07/05/2020 Total lbs lost to date: 11 Total lbs lost since last in-office visit: 5  Interim History: Ann Jordan is down 5 lbs and she is doing well overall. She is following the plan and "not missing anything". She is doing well with her water intake.  Subjective:   1. Vitamin D deficiency Ann Jordan is currently taking high dose Vit D.  2. Other hyperlipidemia Ann Jordan is currently taking Omega 3's.  Assessment/Plan:   1. Vitamin D deficiency Low Vitamin D level contributes to fatigue and are associated with obesity, breast, and colon cancer. We will refill prescription Vitamin D for 1 month. Ann Jordan will follow-up for routine testing of Vitamin D, at least 2-3 times per year to avoid over-replacement.  - Vitamin D, Ergocalciferol, (DRISDOL) 1.25 MG (50000 UNIT) CAPS capsule; Take 1 capsule (50,000 Units total) by mouth every 7 (seven) days.  Dispense: 4 capsule; Refill: 0  2. Other hyperlipidemia Cardiovascular risk and specific lipid/LDL goals reviewed. We discussed several lifestyle modifications today. Ann Jordan will continue to work on diet, exercise and weight loss efforts. She will continue to decrease saturated fats, no trans fats, and increase PUFAs and MUFAs. Orders and follow up as documented in patient record.   Counseling Intensive lifestyle modifications are the first line treatment for this issue. . Dietary changes: Increase soluble fiber. Decrease simple carbohydrates. . Exercise changes: Moderate to  vigorous-intensity aerobic activity 150 minutes per week if tolerated. . Lipid-lowering medications: see documented in medical record.  3. Class 1 obesity due to excess calories with serious comorbidity and body mass index (BMI) of 34.0 to 34.9 in adult Ann Jordan is currently in the action stage of change. As such, her goal is to continue with weight loss efforts. She has agreed to the Category 1 Plan.   We discussed intentional eating. Ann Jordan is planning for trips and doing well.  Exercise goals: As is.  Behavioral modification strategies: increasing lean protein intake, decreasing simple carbohydrates, increasing vegetables, increasing water intake, decreasing eating out, no skipping meals, meal planning and cooking strategies, keeping healthy foods in the home and planning for success.  Ann Jordan has agreed to follow-up with our clinic in 2 weeks. She was informed of the importance of frequent follow-up visits to maximize her success with intensive lifestyle modifications for her multiple health conditions.   Objective:   Blood pressure 121/80, pulse 79, temperature 98.4 F (36.9 C), height 5\' 3"  (1.6 m), weight 194 lb (88 kg), SpO2 97 %. Body mass index is 34.37 kg/m.  General: Cooperative, alert, well developed, in no acute distress. HEENT: Conjunctivae and lids unremarkable. Cardiovascular: Regular rhythm.  Lungs: Normal work of breathing. Neurologic: No focal deficits.   No results found for: CREATININE, BUN, NA, K, CL, CO2 Lab Results  Component Value Date   ALT 36 06/23/2012   AST 23 06/23/2012   ALKPHOS 86 06/23/2012   BILITOT 0.4 06/23/2012   Lab Results  Component Value Date   HGBA1C 5.3 06/06/2020   Lab Results  Component Value Date   INSULIN 8.2 06/06/2020   No results found for: TSH No results found for: CHOL, HDL, LDLCALC, LDLDIRECT, TRIG, CHOLHDL Lab Results  Component Value Date   WBC 5.7 06/23/2012   HGB 13.5 06/23/2012   HCT 40.7 06/23/2012   MCV 95  06/23/2012   PLT 231 06/23/2012   No results found for: IRON, TIBC, FERRITIN  Obesity Behavioral Intervention:   Approximately 15 minutes were spent on the discussion below.  ASK: We discussed the diagnosis of obesity with Ann Jordan today and Ann Jordan agreed to give [place] permission to discuss obesity behavioral modification therapy today.  ASSESS: Ann Jordan has the diagnosis of obesity and her BMI today is 34.37. Ann Jordan is in the action stage of change.   ADVISE: Ann Jordan was educated on the multiple health risks of obesity as well as the benefit of weight loss to improve her health. She was advised of the need for long term treatment and the importance of lifestyle modifications to improve her current health and to decrease her risk of future health problems.  AGREE: Multiple dietary modification options and treatment options were discussed and Ann Jordan agreed to follow the recommendations documented in the above note.  ARRANGE: Ann Jordan was educated on the importance of frequent visits to treat obesity as outlined per CMS and USPSTF guidelines and agreed to schedule her next follow up appointment today.  Attestation Statements:   Reviewed by clinician on day of visit: allergies, medications, problem list, medical history, surgical history, family history, social history, and previous encounter notes.   Wilhemena Durie, am acting as Location manager for [employer], DO.  I have reviewed the above documentation for accuracy and completeness, and I agree with the above. Jearld Lesch, DO

## 2020-07-16 ENCOUNTER — Ambulatory Visit: Payer: PPO | Admitting: Dermatology

## 2020-07-16 ENCOUNTER — Other Ambulatory Visit: Payer: Self-pay

## 2020-07-16 DIAGNOSIS — Z1283 Encounter for screening for malignant neoplasm of skin: Secondary | ICD-10-CM

## 2020-07-16 DIAGNOSIS — D229 Melanocytic nevi, unspecified: Secondary | ICD-10-CM

## 2020-07-16 DIAGNOSIS — L578 Other skin changes due to chronic exposure to nonionizing radiation: Secondary | ICD-10-CM

## 2020-07-16 DIAGNOSIS — L821 Other seborrheic keratosis: Secondary | ICD-10-CM | POA: Diagnosis not present

## 2020-07-16 DIAGNOSIS — L570 Actinic keratosis: Secondary | ICD-10-CM

## 2020-07-16 DIAGNOSIS — L814 Other melanin hyperpigmentation: Secondary | ICD-10-CM | POA: Diagnosis not present

## 2020-07-16 DIAGNOSIS — B079 Viral wart, unspecified: Secondary | ICD-10-CM

## 2020-07-16 DIAGNOSIS — D1800 Hemangioma unspecified site: Secondary | ICD-10-CM

## 2020-07-16 DIAGNOSIS — Z85828 Personal history of other malignant neoplasm of skin: Secondary | ICD-10-CM

## 2020-07-16 NOTE — Progress Notes (Signed)
Follow-Up Visit   Subjective  Ann Jordan is a 65 y.o. female who presents for the following: Total body skin exam (Hx of BCCs) and check spots (Face, rough spots, not sure how long they have been there).   The following portions of the chart were reviewed this encounter and updated as appropriate:       Review of Systems:  No other skin or systemic complaints except as noted in HPI or Assessment and Plan.  Objective  Well appearing patient in no apparent distress; mood and affect are within normal limits.  A full examination was performed including scalp, head, eyes, ears, nose, lips, neck, chest, axillae, abdomen, back, buttocks, bilateral upper extremities, bilateral lower extremities, hands, feet, fingers, toes, fingernails, and toenails. All findings within normal limits unless otherwise noted below.  Objective  Back, R super sternal notch, L nasal ala, L upper earlobe: Well healed scars with no evidence of recurrence.   Objective  Right anterior thigh x 2, R medial knee x 1 (3): Flat pink smooth papules R ant thigh, R med knee  Objective  L nasal dorsum x 1, R upper lip x 1, R forehead at hairline x 1, R lat canthus x 1 (4): Pink scaly macules    Assessment & Plan    Lentigines - Scattered tan macules - Due to sun exposure - Benign-appering, observe - Recommend daily broad spectrum sunscreen SPF 30+ to sun-exposed areas, reapply every 2 hours as needed. - Call for any changes  Seborrheic Keratoses - Stuck-on, waxy, tan-brown papules and/or plaques  - Benign-appearing - Discussed benign etiology and prognosis. - Observe - Call for any changes  Melanocytic Nevi - Tan-brown and/or pink-flesh-colored symmetric macules and papules - Benign appearing on exam today - Observation - Call clinic for new or changing moles - Recommend daily use of broad spectrum spf 30+ sunscreen to sun-exposed areas.   Hemangiomas - Red papules - Discussed benign nature -  Observe - Call for any changes  Actinic Damage - Chronic condition, secondary to cumulative UV/sun exposure - diffuse scaly erythematous macules with underlying dyspigmentation - Recommend daily broad spectrum sunscreen SPF 30+ to sun-exposed areas, reapply every 2 hours as needed.  - Staying in the shade or wearing long sleeves, sun glasses (UVA+UVB protection) and wide brim hats (4-inch brim around the entire circumference of the hat) are also recommended for sun protection.  - Call for new or changing lesions.  Skin cancer screening performed today.   History of basal cell carcinoma (BCC) Back, R super sternal notch, L nasal ala, L upper earlobe  Clear. Observe for recurrence. Call clinic for new or changing lesions.  Recommend regular skin exams, daily broad-spectrum spf 30+ sunscreen use, and photoprotection.     Viral warts, unspecified type (3) Right anterior thigh x 2, R medial knee x 1  Discussed viral etiology and risk of spread.  Discussed multiple treatments may be required to clear warts.  Discussed possible post-treatment dyspigmentation and risk of recurrence.   Destruction of lesion - Right anterior thigh x 2, R medial knee x 1  Destruction method: cryotherapy   Informed consent: discussed and consent obtained   Lesion destroyed using liquid nitrogen: Yes   Region frozen until ice ball extended beyond lesion: Yes   Outcome: patient tolerated procedure well with no complications   Post-procedure details: wound care instructions given    AK (actinic keratosis) (4) L nasal dorsum x 1, R upper lip x 1, R forehead  at hairline x 1, R lat canthus x 1  Destruction of lesion - L nasal dorsum x 1, R upper lip x 1, R forehead at hairline x 1, R lat canthus x 1  Destruction method: cryotherapy   Informed consent: discussed and consent obtained   Lesion destroyed using liquid nitrogen: Yes   Region frozen until ice ball extended beyond lesion: Yes   Outcome: patient  tolerated procedure well with no complications   Post-procedure details: wound care instructions given    Return in about 6 months (around 01/16/2021) for AK f/u.   I, Othelia Pulling, RMA, am acting as scribe for Brendolyn Patty, MD .  Documentation: I have reviewed the above documentation for accuracy and completeness, and I agree with the above.  Brendolyn Patty MD

## 2020-07-16 NOTE — Patient Instructions (Addendum)

## 2020-07-19 ENCOUNTER — Ambulatory Visit (INDEPENDENT_AMBULATORY_CARE_PROVIDER_SITE_OTHER): Payer: PPO | Admitting: Bariatrics

## 2020-07-23 ENCOUNTER — Other Ambulatory Visit: Payer: Self-pay

## 2020-07-23 ENCOUNTER — Encounter (INDEPENDENT_AMBULATORY_CARE_PROVIDER_SITE_OTHER): Payer: Self-pay | Admitting: Bariatrics

## 2020-07-23 ENCOUNTER — Ambulatory Visit (INDEPENDENT_AMBULATORY_CARE_PROVIDER_SITE_OTHER): Payer: PPO | Admitting: Bariatrics

## 2020-07-23 VITALS — BP 127/74 | HR 61 | Temp 97.9°F | Ht 63.0 in | Wt 191.0 lb

## 2020-07-23 DIAGNOSIS — E559 Vitamin D deficiency, unspecified: Secondary | ICD-10-CM | POA: Diagnosis not present

## 2020-07-23 DIAGNOSIS — K5909 Other constipation: Secondary | ICD-10-CM | POA: Diagnosis not present

## 2020-07-23 DIAGNOSIS — M0580 Other rheumatoid arthritis with rheumatoid factor of unspecified site: Secondary | ICD-10-CM

## 2020-07-23 DIAGNOSIS — E7849 Other hyperlipidemia: Secondary | ICD-10-CM

## 2020-07-23 DIAGNOSIS — Z6836 Body mass index (BMI) 36.0-36.9, adult: Secondary | ICD-10-CM | POA: Diagnosis not present

## 2020-07-23 MED ORDER — VITAMIN D (ERGOCALCIFEROL) 1.25 MG (50000 UNIT) PO CAPS: 50000.0000 [IU] | ORAL_CAPSULE | ORAL | 0 refills | Status: DC

## 2020-07-25 ENCOUNTER — Encounter (INDEPENDENT_AMBULATORY_CARE_PROVIDER_SITE_OTHER): Payer: Self-pay | Admitting: Bariatrics

## 2020-07-25 NOTE — Progress Notes (Signed)
Chief Complaint:   OBESITY Ann Jordan is here to discuss her progress with her obesity treatment plan along with follow-up of her obesity related diagnoses. Ann Jordan is on the Category 1 Plan and states she is following her eating plan approximately 100% of the time. Ann Jordan states she is walking and biking 30-60 minutes 3-5 times per week.  Today's visit was #: 4 Starting weight: 205 lbs Starting date: 06/06/2020 Today's weight: 191 lbs Today's date: 07/23/2020 Total lbs lost to date: 14 Total lbs lost since last in-office visit: 3  Interim History: Ann Jordan is down an additional 3 lbs.  Subjective:   1. Other hyperlipidemia Fe is taking fish oil.  2. 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  3. Other constipation Ann Jordan reports constipation due to increased protein intake.  4. Vitamin D deficiency Ann Jordan is taking high dose Vit D.  Assessment/Plan:   1. Other hyperlipidemia Cardiovascular risk and specific lipid/LDL goals reviewed.  We discussed several lifestyle modifications today and Ann Jordan will continue to work on diet, exercise and weight loss efforts. Orders and follow up as documented in patient record. Continue fish oil.  Counseling Intensive lifestyle modifications are the first line treatment for this issue. . Dietary changes: Increase soluble fiber. Decrease simple carbohydrates. . Exercise changes: Moderate to vigorous-intensity aerobic activity 150 minutes per week if tolerated. . Lipid-lowering medications: see documented in medical record.  2. Polyarthritis with positive rheumatoid factor (HCC) Continue exercise.  3. Other constipation Increase water intake. Increase raw vegetables. OTC Miralax or Metamucil daily.  4. Vitamin D deficiency Low Vitamin D level contributes to fatigue and are associated with obesity, breast, and colon cancer. She agrees to continue to take prescription Vitamin D @50 ,000 IU every week  and will follow-up for routine testing of Vitamin D, at least 2-3 times per year to avoid over-replacement.  - Vitamin D, Ergocalciferol, (DRISDOL) 1.25 MG (50000 UNIT) CAPS capsule; Take 1 capsule (50,000 Units total) by mouth every 7 (seven) days.  Dispense: 4 capsule; Refill: 0  5. Obesity, current BMI 60 Ann Jordan is currently in the action stage of change. As such, her goal is to continue with weight loss efforts. She has agreed to the Category 1 Plan.   Exercise goals: As is  Behavioral modification strategies: increasing lean protein intake, decreasing simple carbohydrates, increasing vegetables, increasing water intake, decreasing eating out, no skipping meals, meal planning and cooking strategies, keeping healthy foods in the home and planning for success.  Ann Jordan has agreed to follow-up with our clinic in 2-3 weeks. She was informed of the importance of frequent follow-up visits to maximize her success with intensive lifestyle modifications for her multiple health conditions.   Objective:   Blood pressure 127/74, pulse 61, temperature 97.9 F (36.6 C), height 5\' 3"  (1.6 m), weight 191 lb (86.6 kg), SpO2 98 %. Body mass index is 33.83 kg/m.  General: Cooperative, alert, well developed, in no acute distress. HEENT: Conjunctivae and lids unremarkable. Cardiovascular: Regular rhythm.  Lungs: Normal work of breathing. Neurologic: No focal deficits.   No results found for: CREATININE, BUN, NA, K, CL, CO2 Lab Results  Component Value Date   ALT 36 06/23/2012   AST 23 06/23/2012   ALKPHOS 86 06/23/2012   BILITOT 0.4 06/23/2012   Lab Results  Component Value Date   HGBA1C 5.3 06/06/2020   Lab Results  Component Value Date   INSULIN 8.2 06/06/2020   No results found for: TSH No results found  for: CHOL, HDL, LDLCALC, LDLDIRECT, TRIG, CHOLHDL Lab Results  Component Value Date   WBC 5.7 06/23/2012   HGB 13.5 06/23/2012   HCT 40.7 06/23/2012   MCV 95 06/23/2012   PLT 231  06/23/2012   No results found for: IRON, TIBC, FERRITIN  Obesity Behavioral Intervention:   Approximately 15 minutes were spent on the discussion below.  ASK: We discussed the diagnosis of obesity with Ann Jordan today and Ann Jordan agreed to give [place] permission to discuss obesity behavioral modification therapy today.  ASSESS: Ann Jordan has the diagnosis of obesity and her BMI today is 33.8. Ann Jordan is in the action stage of change.   ADVISE: Ann Jordan was educated on the multiple health risks of obesity as well as the benefit of weight loss to improve her health. She was advised of the need for long term treatment and the importance of lifestyle modifications to improve her current health and to decrease her risk of future health problems.  AGREE: Multiple dietary modification options and treatment options were discussed and Ann Jordan agreed to follow the recommendations documented in the above note.  ARRANGE: Ann Jordan was educated on the importance of frequent visits to treat obesity as outlined per CMS and USPSTF guidelines and agreed to schedule her next follow up appointment today.  Attestation Statements:   Reviewed by clinician on day of visit: allergies, medications, problem list, medical history, surgical history, family history, social history, and previous encounter notes.  Coral Ceo, am acting as Location manager for [employer], DO.  I have reviewed the above documentation for accuracy and completeness, and I agree with the above. Jearld Lesch, DO

## 2020-08-11 DIAGNOSIS — G4733 Obstructive sleep apnea (adult) (pediatric): Secondary | ICD-10-CM | POA: Diagnosis not present

## 2020-08-13 ENCOUNTER — Other Ambulatory Visit: Payer: Self-pay

## 2020-08-13 ENCOUNTER — Ambulatory Visit (INDEPENDENT_AMBULATORY_CARE_PROVIDER_SITE_OTHER): Payer: PPO | Admitting: Bariatrics

## 2020-08-13 ENCOUNTER — Encounter (INDEPENDENT_AMBULATORY_CARE_PROVIDER_SITE_OTHER): Payer: Self-pay | Admitting: Bariatrics

## 2020-08-13 VITALS — BP 127/81 | HR 68 | Temp 97.7°F | Ht 63.0 in | Wt 185.0 lb

## 2020-08-13 DIAGNOSIS — Z6836 Body mass index (BMI) 36.0-36.9, adult: Secondary | ICD-10-CM

## 2020-08-13 DIAGNOSIS — E7849 Other hyperlipidemia: Secondary | ICD-10-CM

## 2020-08-13 DIAGNOSIS — E559 Vitamin D deficiency, unspecified: Secondary | ICD-10-CM | POA: Diagnosis not present

## 2020-08-14 ENCOUNTER — Encounter (INDEPENDENT_AMBULATORY_CARE_PROVIDER_SITE_OTHER): Payer: Self-pay | Admitting: Bariatrics

## 2020-08-14 NOTE — Progress Notes (Signed)
Chief Complaint:   OBESITY Krisinda is here to discuss her progress with her obesity treatment plan along with follow-up of her obesity related diagnoses. Psalm is on the Category 1 Plan and states she is following her eating plan approximately 99% of the time. Isyss states she is walking and biking 30-60 minutes 3-5 times per week.  Today's visit was #: 5 Starting weight: 205 lbs Starting date: 06/06/2020 Today's weight: 185 lbs Today's date: 08/13/2020 Total lbs lost to date: 20 Total lbs lost since last in-office visit: 6  Interim History: Nekeya is down another 6 lbs and doing very well overall. She denies any struggles.  Subjective:   1. Other hyperlipidemia Keyonni is taking fish oil.  2. Vitamin D deficiency Gayla is taking high dose Vit D.  Assessment/Plan:   1. Other hyperlipidemia Cardiovascular risk and specific lipid/LDL goals reviewed.  We discussed several lifestyle modifications today and Juanna will continue to work on diet, exercise and weight loss efforts. Orders and follow up as documented in patient record. Will continue to limit saturated fats and no trans fats.  Counseling Intensive lifestyle modifications are the first line treatment for this issue. . Dietary changes: Increase soluble fiber. Decrease simple carbohydrates. . Exercise changes: Moderate to vigorous-intensity aerobic activity 150 minutes per week if tolerated. . Lipid-lowering medications: see documented in medical record.  2. Vitamin D deficiency Low Vitamin D level contributes to fatigue and are associated with obesity, breast, and colon cancer. She agrees to continue to take prescription Vitamin D @50 ,000 IU every week and will follow-up for routine testing of Vitamin D, at least 2-3 times per year to avoid over-replacement.  3. Obesity, current BMI 44 Maevis is currently in the action stage of change. As such, her goal is to continue with weight loss efforts. She has agreed to the Category 1  Plan.   Meal plan  Intentional eating  Exercise goals: As is  Behavioral modification strategies: increasing lean protein intake, decreasing simple carbohydrates, increasing vegetables, increasing water intake, decreasing eating out, no skipping meals, meal planning and cooking strategies, keeping healthy foods in the home and planning for success.  Alyrica has agreed to follow-up with our clinic in 2-3 weeks. She was informed of the importance of frequent follow-up visits to maximize her success with intensive lifestyle modifications for her multiple health conditions.   Objective:   Blood pressure 127/81, pulse 68, temperature 97.7 F (36.5 C), height 5\' 3"  (1.6 m), weight 185 lb (83.9 kg), SpO2 98 %. Body mass index is 32.77 kg/m.  General: Cooperative, alert, well developed, in no acute distress. HEENT: Conjunctivae and lids unremarkable. Cardiovascular: Regular rhythm.  Lungs: Normal work of breathing. Neurologic: No focal deficits.   No results found for: CREATININE, BUN, NA, K, CL, CO2 Lab Results  Component Value Date   ALT 36 06/23/2012   AST 23 06/23/2012   ALKPHOS 86 06/23/2012   BILITOT 0.4 06/23/2012   Lab Results  Component Value Date   HGBA1C 5.3 06/06/2020   Lab Results  Component Value Date   INSULIN 8.2 06/06/2020   No results found for: TSH No results found for: CHOL, HDL, LDLCALC, LDLDIRECT, TRIG, CHOLHDL Lab Results  Component Value Date   WBC 5.7 06/23/2012   HGB 13.5 06/23/2012   HCT 40.7 06/23/2012   MCV 95 06/23/2012   PLT 231 06/23/2012   No results found for: IRON, TIBC, FERRITIN  Obesity Behavioral Intervention:   Approximately 15 minutes were spent on  the discussion below.  ASK: We discussed the diagnosis of obesity with Duyen today and Orvilla agreed to give [place] permission to discuss obesity behavioral modification therapy today.  ASSESS: Levern has the diagnosis of obesity and her BMI today is 29.9. Mataya is in the action stage of  change.   ADVISE: Lechelle was educated on the multiple health risks of obesity as well as the benefit of weight loss to improve her health. She was advised of the need for long term treatment and the importance of lifestyle modifications to improve her current health and to decrease her risk of future health problems.  AGREE: Multiple dietary modification options and treatment options were discussed and Nozomi agreed to follow the recommendations documented in the above note.  ARRANGE: Yesica was educated on the importance of frequent visits to treat obesity as outlined per CMS and USPSTF guidelines and agreed to schedule her next follow up appointment today.  Attestation Statements:   Reviewed by clinician on day of visit: allergies, medications, problem list, medical history, surgical history, family history, social history, and previous encounter notes.  Coral Ceo, am acting as Location manager for [employer], DO.  I have reviewed the above documentation for accuracy and completeness, and I agree with the above. Jearld Lesch, DO

## 2020-09-03 ENCOUNTER — Encounter (INDEPENDENT_AMBULATORY_CARE_PROVIDER_SITE_OTHER): Payer: Self-pay | Admitting: Bariatrics

## 2020-09-03 ENCOUNTER — Ambulatory Visit (INDEPENDENT_AMBULATORY_CARE_PROVIDER_SITE_OTHER): Payer: PPO | Admitting: Bariatrics

## 2020-09-03 ENCOUNTER — Other Ambulatory Visit: Payer: Self-pay

## 2020-09-03 VITALS — BP 131/86 | HR 69 | Temp 97.8°F | Ht 63.0 in | Wt 182.0 lb

## 2020-09-03 DIAGNOSIS — E66812 Obesity, class 2: Secondary | ICD-10-CM

## 2020-09-03 DIAGNOSIS — Z6836 Body mass index (BMI) 36.0-36.9, adult: Secondary | ICD-10-CM

## 2020-09-03 DIAGNOSIS — K219 Gastro-esophageal reflux disease without esophagitis: Secondary | ICD-10-CM | POA: Diagnosis not present

## 2020-09-03 DIAGNOSIS — E559 Vitamin D deficiency, unspecified: Secondary | ICD-10-CM | POA: Diagnosis not present

## 2020-09-03 MED ORDER — VITAMIN D (ERGOCALCIFEROL) 1.25 MG (50000 UNIT) PO CAPS: 50000.0000 [IU] | ORAL_CAPSULE | ORAL | 0 refills | Status: DC

## 2020-09-04 ENCOUNTER — Encounter (INDEPENDENT_AMBULATORY_CARE_PROVIDER_SITE_OTHER): Payer: Self-pay | Admitting: Bariatrics

## 2020-09-04 DIAGNOSIS — E7849 Other hyperlipidemia: Secondary | ICD-10-CM | POA: Diagnosis not present

## 2020-09-04 DIAGNOSIS — M3390 Dermatopolymyositis, unspecified, organ involvement unspecified: Secondary | ICD-10-CM | POA: Diagnosis not present

## 2020-09-04 DIAGNOSIS — G4733 Obstructive sleep apnea (adult) (pediatric): Secondary | ICD-10-CM | POA: Diagnosis not present

## 2020-09-04 NOTE — Progress Notes (Signed)
Chief Complaint:   OBESITY Ann Jordan is here to discuss her progress with her obesity treatment plan along with follow-up of her obesity related diagnoses. Ann Jordan is on the Category 1 Plan and states she is following her eating plan approximately 75% of the time. Ann Jordan states she is walking 5 hours.  Today's visit was #: 6 Starting weight: 205 lbs Starting date: 06/06/2020 Today's weight: 182 lbs Today's date: 09/03/2020 Total lbs lost to date: 23 lbs Total lbs lost since last in-office visit: 3  Interim History: Ann Jordan is down an additional 3 lbs and doing well overall.  Subjective:   1. Gastroesophageal reflux disease without esophagitis Ann Jordan is taking Prilosec to tx sx's.  2. Vitamin D deficiency Ann Jordan denies nausea, vomiting, and muscle weakness.  Assessment/Plan:   1. Gastroesophageal reflux disease without esophagitis Intensive lifestyle modifications are the first line treatment for this issue. We discussed several lifestyle modifications today and she will continue to work on diet, exercise and weight loss efforts. Orders and follow up as documented in patient record.  -Continue Prilosec  Counseling . If a person has gastroesophageal reflux disease (GERD), food and stomach acid move back up into the esophagus and cause symptoms or problems such as damage to the esophagus. . Anti-reflux measures include: raising the head of the bed, avoiding tight clothing or belts, avoiding eating late at night, not lying down shortly after mealtime, and achieving weight loss. . Avoid ASA, NSAID's, caffeine, alcohol, and tobacco.  . OTC Pepcid and/or Tums are often very helpful for as needed use.  Marland Kitchen However, for persisting chronic or daily symptoms, stronger medications like Omeprazole may be needed. . You may need to avoid foods and drinks such as: ? Coffee and tea (with or without caffeine). ? Drinks that contain alcohol. ? Energy drinks and sports drinks. ? Bubbly (carbonated)  drinks or sodas. ? Chocolate and cocoa. ? Peppermint and mint flavorings. ? Garlic and onions. ? Horseradish. ? Spicy and acidic foods. These include peppers, chili powder, curry powder, vinegar, hot sauces, and BBQ sauce. ? Citrus fruit juices and citrus fruits, such as oranges, lemons, and limes. ? Tomato-based foods. These include red sauce, chili, salsa, and pizza with red sauce. ? Fried and fatty foods. These include donuts, french fries, potato chips, and high-fat dressings. ? High-fat meats. These include hot dogs, rib eye steak, sausage, ham, and bacon.  2. Vitamin D deficiency Low Vitamin D level contributes to fatigue and are associated with obesity, breast, and colon cancer. She agrees to continue to take prescription Vitamin D @50 ,000 IU every week and will follow-up for routine testing of Vitamin D, at least 2-3 times per year to avoid over-replacement. - Vitamin D, Ergocalciferol, (DRISDOL) 1.25 MG (50000 UNIT) CAPS capsule; Take 1 capsule (50,000 Units total) by mouth every 7 (seven) days.  Dispense: 4 capsule; Refill: 0  3. Obesity, current BMI 31  Ann Jordan is currently in the action stage of change. As such, her goal is to continue with weight loss efforts. She has agreed to the Category 1 Plan.   Meal plan Intentional eating "On the Road" - making better choices.  Exercise goals: As is  Behavioral modification strategies: increasing lean protein intake, decreasing simple carbohydrates, increasing vegetables, increasing water intake, decreasing eating out, no skipping meals, meal planning and cooking strategies, keeping healthy foods in the home and planning for success.  Ann Jordan has agreed to follow-up with our clinic in 2 weeks. She was informed of the importance  of frequent follow-up visits to maximize her success with intensive lifestyle modifications for her multiple health conditions.   Objective:   Blood pressure 131/86, pulse 69, temperature 97.8 F (36.6 C),  height 5\' 3"  (1.6 m), weight 182 lb (82.6 kg), SpO2 100 %. Body mass index is 32.24 kg/m.  General: Cooperative, alert, well developed, in no acute distress. HEENT: Conjunctivae and lids unremarkable. Cardiovascular: Regular rhythm.  Lungs: Normal work of breathing. Neurologic: No focal deficits.   No results found for: CREATININE, BUN, NA, K, CL, CO2 Lab Results  Component Value Date   ALT 36 06/23/2012   AST 23 06/23/2012   ALKPHOS 86 06/23/2012   BILITOT 0.4 06/23/2012   Lab Results  Component Value Date   HGBA1C 5.3 06/06/2020   Lab Results  Component Value Date   INSULIN 8.2 06/06/2020   No results found for: TSH No results found for: CHOL, HDL, LDLCALC, LDLDIRECT, TRIG, CHOLHDL Lab Results  Component Value Date   WBC 5.7 06/23/2012   HGB 13.5 06/23/2012   HCT 40.7 06/23/2012   MCV 95 06/23/2012   PLT 231 06/23/2012   No results found for: IRON, TIBC, FERRITIN  Obesity Behavioral Intervention:   Approximately 15 minutes were spent on the discussion below.  ASK: We discussed the diagnosis of obesity with Ann Jordan today and Ann Jordan agreed to give [place] permission to discuss obesity behavioral modification therapy today.  ASSESS: Ann Jordan has the diagnosis of obesity and her BMI today is 32.3. Ann Jordan is in the action stage of change.   ADVISE: Ann Jordan was educated on the multiple health risks of obesity as well as the benefit of weight loss to improve her health. She was advised of the need for long term treatment and the importance of lifestyle modifications to improve her current health and to decrease her risk of future health problems.  AGREE: Multiple dietary modification options and treatment options were discussed and Ann Jordan agreed to follow the recommendations documented in the above note.  ARRANGE: Ann Jordan was educated on the importance of frequent visits to treat obesity as outlined per CMS and USPSTF guidelines and agreed to schedule her next follow up appointment  today.  Attestation Statements:   Reviewed by clinician on day of visit: allergies, medications, problem list, medical history, surgical history, family history, social history, and previous encounter notes.  Coral Ceo, CMA, am acting as Location manager for [employer], DO.  I have reviewed the above documentation for accuracy and completeness, and I agree with the above. Jearld Lesch, DO

## 2020-09-10 DIAGNOSIS — G4733 Obstructive sleep apnea (adult) (pediatric): Secondary | ICD-10-CM | POA: Diagnosis not present

## 2020-09-11 DIAGNOSIS — M0580 Other rheumatoid arthritis with rheumatoid factor of unspecified site: Secondary | ICD-10-CM | POA: Diagnosis not present

## 2020-09-11 DIAGNOSIS — K219 Gastro-esophageal reflux disease without esophagitis: Secondary | ICD-10-CM | POA: Diagnosis not present

## 2020-09-11 DIAGNOSIS — F3342 Major depressive disorder, recurrent, in full remission: Secondary | ICD-10-CM | POA: Diagnosis not present

## 2020-09-11 DIAGNOSIS — E7849 Other hyperlipidemia: Secondary | ICD-10-CM | POA: Diagnosis not present

## 2020-09-11 DIAGNOSIS — Z0000 Encounter for general adult medical examination without abnormal findings: Secondary | ICD-10-CM | POA: Diagnosis not present

## 2020-09-11 DIAGNOSIS — Z853 Personal history of malignant neoplasm of breast: Secondary | ICD-10-CM | POA: Diagnosis not present

## 2020-09-11 DIAGNOSIS — G4733 Obstructive sleep apnea (adult) (pediatric): Secondary | ICD-10-CM | POA: Diagnosis not present

## 2020-09-20 ENCOUNTER — Ambulatory Visit (INDEPENDENT_AMBULATORY_CARE_PROVIDER_SITE_OTHER): Payer: PPO | Admitting: Family Medicine

## 2020-09-20 ENCOUNTER — Other Ambulatory Visit: Payer: Self-pay

## 2020-09-20 ENCOUNTER — Encounter (INDEPENDENT_AMBULATORY_CARE_PROVIDER_SITE_OTHER): Payer: Self-pay | Admitting: Family Medicine

## 2020-09-20 VITALS — BP 108/69 | HR 72 | Temp 97.7°F | Ht 63.0 in | Wt 178.0 lb

## 2020-09-20 DIAGNOSIS — Z6835 Body mass index (BMI) 35.0-35.9, adult: Secondary | ICD-10-CM | POA: Diagnosis not present

## 2020-09-20 DIAGNOSIS — E559 Vitamin D deficiency, unspecified: Secondary | ICD-10-CM

## 2020-09-20 DIAGNOSIS — E8881 Metabolic syndrome: Secondary | ICD-10-CM

## 2020-09-20 DIAGNOSIS — E6609 Other obesity due to excess calories: Secondary | ICD-10-CM

## 2020-09-20 MED ORDER — VITAMIN D (ERGOCALCIFEROL) 1.25 MG (50000 UNIT) PO CAPS: 50000.0000 [IU] | ORAL_CAPSULE | ORAL | 0 refills | Status: DC

## 2020-09-26 NOTE — Progress Notes (Signed)
Chief Complaint:   OBESITY Ann Jordan is here to discuss her progress with her obesity treatment plan along with follow-up of her obesity related diagnoses. Ann Jordan is on the Category 1 Plan and states she is following her eating plan approximately 90-95% of the time. Ann Jordan states she is walking and exercise bike 60 minutes 7 times per week.  Today's visit was #: 7 Starting weight: 205 lbs Starting date: 06/06/2020 Today's weight: 178 lbs Today's date: 09/20/2020 Total lbs lost to date: 27 Total lbs lost since last in-office visit: 4  Interim History: Ann Jordan has celebrated her birthday and some other birthdays over the last few weeks. She is going on a road trip to [place], then up to [facility] over the next few weeks.  Subjective:   1. Vitamin D deficiency Ann Jordan denies nausea, vomiting, and muscle weakness but notes fatigue. Her last Vit D level was 34.2.  2. Insulin resistance Ann Jordan's A1c is 5.3 and insulin level 8.2. Some substitutions made.  Assessment/Plan:   1. Vitamin D deficiency Low Vitamin D level contributes to fatigue and are associated with obesity, breast, and colon cancer. She agrees to continue to take prescription Vitamin D @50 ,000 IU every week and will follow-up for routine testing of Vitamin D, at least 2-3 times per year to avoid over-replacement.  - Vitamin D, Ergocalciferol, (DRISDOL) 1.25 MG (50000 UNIT) CAPS capsule; Take 1 capsule (50,000 Units total) by mouth every 7 (seven) days.  Dispense: 4 capsule; Refill: 0  2. Insulin resistance Ann Jordan will continue to work on weight loss, exercise, and decreasing simple carbohydrates to help decrease the risk of diabetes. Ann Jordan agreed to follow-up with [place] as directed to closely monitor her progress. Continue category 1 or journaling.  3. Class 2 obesity due to excess calories without serious comorbidity with body mass index (BMI) of 35.0 to 35.9 in adult  Ann Jordan is currently in the action stage of change. As  such, her goal is to continue with weight loss efforts. She has agreed to keeping a food journal and adhering to recommended goals of 1000-1100 calories and 75+ g protein.   Exercise goals: All adults should avoid inactivity. Some physical activity is better than none, and adults who participate in any amount of physical activity gain some health benefits.  Behavioral modification strategies: increasing lean protein intake, meal planning and cooking strategies, keeping healthy foods in the home and planning for success.  Ann Jordan has agreed to follow-up with our clinic in 3 weeks. She was informed of the importance of frequent follow-up visits to maximize her success with intensive lifestyle modifications for her multiple health conditions.   Objective:   Blood pressure 108/69, pulse 72, temperature 97.7 F (36.5 C), height 5\' 3"  (1.6 m), weight 178 lb (80.7 kg), SpO2 98 %. Body mass index is 31.53 kg/m.  General: Cooperative, alert, well developed, in no acute distress. HEENT: Conjunctivae and lids unremarkable. Cardiovascular: Regular rhythm.  Lungs: Normal work of breathing. Neurologic: No focal deficits.   No results found for: CREATININE, BUN, NA, K, CL, CO2 Lab Results  Component Value Date   ALT 36 06/23/2012   AST 23 06/23/2012   ALKPHOS 86 06/23/2012   BILITOT 0.4 06/23/2012   Lab Results  Component Value Date   HGBA1C 5.3 06/06/2020   Lab Results  Component Value Date   INSULIN 8.2 06/06/2020   No results found for: TSH No results found for: CHOL, HDL, LDLCALC, LDLDIRECT, TRIG, CHOLHDL Lab Results  Component  Value Date   WBC 5.7 06/23/2012   HGB 13.5 06/23/2012   HCT 40.7 06/23/2012   MCV 95 06/23/2012   PLT 231 06/23/2012   No results found for: IRON, TIBC, FERRITIN  Obesity Behavioral Intervention:   Approximately 15 minutes were spent on the discussion below.  ASK: We discussed the diagnosis of obesity with Ann Jordan today and Ann Jordan agreed to give [place]  permission to discuss obesity behavioral modification therapy today.  ASSESS: Ann Jordan has the diagnosis of obesity and her BMI today is 31.5. Ann Jordan is in the action stage of change.   ADVISE: Ann Jordan was educated on the multiple health risks of obesity as well as the benefit of weight loss to improve her health. She was advised of the need for long term treatment and the importance of lifestyle modifications to improve her current health and to decrease her risk of future health problems.  AGREE: Multiple dietary modification options and treatment options were discussed and Ann Jordan agreed to follow the recommendations documented in the above note.  ARRANGE: Ann Jordan was educated on the importance of frequent visits to treat obesity as outlined per CMS and USPSTF guidelines and agreed to schedule her next follow up appointment today.  Attestation Statements:   Reviewed by clinician on day of visit: allergies, medications, problem list, medical history, surgical history, family history, social history, and previous encounter notes.  Coral Ceo, CMA, am acting as transcriptionist for Coralie Common, MD.  I have reviewed the above documentation for accuracy and completeness, and I agree with the above. - Jinny Blossom, MD

## 2020-10-11 DIAGNOSIS — G4733 Obstructive sleep apnea (adult) (pediatric): Secondary | ICD-10-CM | POA: Diagnosis not present

## 2020-10-13 DIAGNOSIS — G4733 Obstructive sleep apnea (adult) (pediatric): Secondary | ICD-10-CM | POA: Diagnosis not present

## 2020-10-15 ENCOUNTER — Ambulatory Visit (INDEPENDENT_AMBULATORY_CARE_PROVIDER_SITE_OTHER): Payer: PPO | Admitting: Bariatrics

## 2020-10-31 ENCOUNTER — Ambulatory Visit (INDEPENDENT_AMBULATORY_CARE_PROVIDER_SITE_OTHER): Payer: PPO | Admitting: Bariatrics

## 2020-10-31 ENCOUNTER — Other Ambulatory Visit: Payer: Self-pay

## 2020-10-31 ENCOUNTER — Encounter (INDEPENDENT_AMBULATORY_CARE_PROVIDER_SITE_OTHER): Payer: Self-pay | Admitting: Bariatrics

## 2020-10-31 VITALS — BP 117/76 | HR 70 | Temp 98.1°F | Ht 63.0 in | Wt 176.0 lb

## 2020-10-31 DIAGNOSIS — Z6836 Body mass index (BMI) 36.0-36.9, adult: Secondary | ICD-10-CM | POA: Diagnosis not present

## 2020-10-31 DIAGNOSIS — E7849 Other hyperlipidemia: Secondary | ICD-10-CM | POA: Diagnosis not present

## 2020-10-31 DIAGNOSIS — E559 Vitamin D deficiency, unspecified: Secondary | ICD-10-CM

## 2020-10-31 MED ORDER — VITAMIN D (ERGOCALCIFEROL) 1.25 MG (50000 UNIT) PO CAPS: 50000.0000 [IU] | ORAL_CAPSULE | ORAL | 0 refills | Status: DC

## 2020-11-01 NOTE — Progress Notes (Signed)
Chief Complaint:   OBESITY Ann Jordan is here to discuss her progress with her obesity treatment plan along with follow-up of her obesity related diagnoses. Ann Jordan is on the Category 1 Plan and states she is following her eating plan approximately 50% of the time. Ann Jordan states she is walking for 60 minutes 3 times per week.  Today's visit was #: 8 Starting weight: 205 lbs Starting date: 06/06/2020 Today's weight: 176 lbs Today's date: 10/31/2020 Total lbs lost to date: 29 lbs Total lbs lost since last in-office visit: 2 lbs  Interim History: Ann Jordan is down 2 lbs since her last visit.  She went on vacation and had been in the car a lot. She is doing well with her water intake.  Subjective:   1. Other hyperlipidemia Ann Jordan is taking fish oil.  2. Vitamin D deficiency Ann Jordan is taking Vitamin D as directed.  Assessment/Plan:   1. Other hyperlipidemia Cardiovascular risk and specific lipid/LDL goals reviewed.  Ann Jordan will continue with zero trans fats and limited saturated fats. She will stay active. We discussed several lifestyle modifications today and Ann Jordan will continue to work on diet, exercise and weight loss efforts. Orders and follow up as documented in patient record.   Counseling Intensive lifestyle modifications are the first line treatment for this issue. Dietary changes: Increase soluble fiber. Decrease simple carbohydrates. Exercise changes: Moderate to vigorous-intensity aerobic activity 150 minutes per week if tolerated. Lipid-lowering medications: see documented in medical record.   2. Vitamin D deficiency Low Vitamin D level contributes to fatigue and are associated with obesity, breast, and colon cancer. We will refill her Vitamin D for 1 month with no refills.She agrees to continue to take prescription Vitamin D 50,000 IU every week and will follow-up for routine testing of Vitamin D, at least 2-3 times per year to avoid over-replacement.  - Vitamin D,  Ergocalciferol, (DRISDOL) 1.25 MG (50000 UNIT) CAPS capsule; Take 1 capsule (50,000 Units total) by mouth every 7 (seven) days.  Dispense: 4 capsule; Refill: 0  3. obesity, current BMI 31.3 Ann Jordan is currently in the action stage of change. As such, her goal is to continue with weight loss efforts. She has agreed to the Category 1 Plan.   Ann Jordan will continue to meal plan. She will continue intentional eating. She will focus on protein and weigh the meat.  Exercise goals:  As is.  Behavioral modification strategies: increasing lean protein intake, decreasing simple carbohydrates, increasing vegetables, increasing water intake, decreasing eating out, no skipping meals, meal planning and cooking strategies, keeping healthy foods in the home, and planning for success.  Ann Jordan has agreed to follow-up with our clinic in 2-3 weeks. She was informed of the importance of frequent follow-up visits to maximize her success with intensive lifestyle modifications for her multiple health conditions.   Objective:   Blood pressure 117/76, pulse 70, temperature 98.1 F (36.7 C), height 5\' 3"  (1.6 m), weight 176 lb (79.8 kg), SpO2 97 %. Body mass index is 31.18 kg/m.  General: Cooperative, alert, well developed, in no acute distress. HEENT: Conjunctivae and lids unremarkable. Cardiovascular: Regular rhythm.  Lungs: Normal work of breathing. Neurologic: No focal deficits.   No results found for: CREATININE, BUN, NA, K, CL, CO2 Lab Results  Component Value Date   ALT 36 06/23/2012   AST 23 06/23/2012   ALKPHOS 86 06/23/2012   BILITOT 0.4 06/23/2012   Lab Results  Component Value Date   HGBA1C 5.3 06/06/2020   Lab Results  Component Value Date   INSULIN 8.2 06/06/2020   No results found for: TSH No results found for: CHOL, HDL, LDLCALC, LDLDIRECT, TRIG, CHOLHDL Lab Results  Component Value Date   VD25OH 34.2 06/06/2020   Lab Results  Component Value Date   WBC 5.7 06/23/2012   HGB 13.5  06/23/2012   HCT 40.7 06/23/2012   MCV 95 06/23/2012   PLT 231 06/23/2012   No results found for: IRON, TIBC, FERRITIN  Obesity Behavioral Intervention:   Approximately 15 minutes were spent on the discussion below.  ASK: We discussed the diagnosis of obesity with Ann Jordan today and Ann Jordan agreed to give [place] permission to discuss obesity behavioral modification therapy today.  ASSESS: Keyshia has the diagnosis of obesity and her BMI today is 31.3. Ann Jordan is in the action stage of change.   ADVISE: Ann Jordan was educated on the multiple health risks of obesity as well as the benefit of weight loss to improve her health. She was advised of the need for long term treatment and the importance of lifestyle modifications to improve her current health and to decrease her risk of future health problems.  AGREE: Multiple dietary modification options and treatment options were discussed and Ann Jordan agreed to follow the recommendations documented in the above note.  ARRANGE: Ann Jordan was educated on the importance of frequent visits to treat obesity as outlined per CMS and USPSTF guidelines and agreed to schedule her next follow up appointment today.  Attestation Statements:   Reviewed by clinician on day of visit: allergies, medications, problem list, medical history, surgical history, family history, social history, and previous encounter notes.  I, Lizbeth Bark, RMA, am acting as Location manager for [employer], DO.  I have reviewed the above documentation for accuracy and completeness, and I agree with the above. Jearld Lesch, DO

## 2020-11-02 DIAGNOSIS — U071 COVID-19: Secondary | ICD-10-CM | POA: Diagnosis not present

## 2020-11-02 DIAGNOSIS — Z03818 Encounter for observation for suspected exposure to other biological agents ruled out: Secondary | ICD-10-CM | POA: Diagnosis not present

## 2020-11-05 ENCOUNTER — Encounter (INDEPENDENT_AMBULATORY_CARE_PROVIDER_SITE_OTHER): Payer: Self-pay | Admitting: Bariatrics

## 2020-11-10 DIAGNOSIS — G4733 Obstructive sleep apnea (adult) (pediatric): Secondary | ICD-10-CM | POA: Diagnosis not present

## 2020-11-14 ENCOUNTER — Encounter (INDEPENDENT_AMBULATORY_CARE_PROVIDER_SITE_OTHER): Payer: Self-pay | Admitting: Bariatrics

## 2020-11-14 ENCOUNTER — Ambulatory Visit (INDEPENDENT_AMBULATORY_CARE_PROVIDER_SITE_OTHER): Payer: PPO | Admitting: Bariatrics

## 2020-11-14 ENCOUNTER — Other Ambulatory Visit: Payer: Self-pay

## 2020-11-14 VITALS — BP 115/76 | HR 74 | Temp 98.1°F | Ht 63.0 in | Wt 173.0 lb

## 2020-11-14 DIAGNOSIS — E7849 Other hyperlipidemia: Secondary | ICD-10-CM

## 2020-11-14 DIAGNOSIS — E8881 Metabolic syndrome: Secondary | ICD-10-CM | POA: Diagnosis not present

## 2020-11-14 DIAGNOSIS — Z6836 Body mass index (BMI) 36.0-36.9, adult: Secondary | ICD-10-CM

## 2020-11-19 ENCOUNTER — Encounter (INDEPENDENT_AMBULATORY_CARE_PROVIDER_SITE_OTHER): Payer: Self-pay | Admitting: Bariatrics

## 2020-11-19 NOTE — Progress Notes (Signed)
Chief Complaint:   OBESITY Ann Jordan is here to discuss her progress with her obesity treatment plan along with follow-up of her obesity related diagnoses. Cameka is on the Category 1 Plan and states she is following her eating plan approximately 85% of the time. Aliona states she is doing 0 minutes 0 times per week.  Today's visit was #: 9 Starting weight: 205 lbs Starting date: 06/06/2020 Today's weight: 173 lbs Today's date:11/14/2020 Total lbs lost to date: 32 lbs Total lbs lost since last in-office visit: 3 lbs  Interim History: Melica is down an additional 3 lbs. Her appetite is altered due to having Covid 19.   Subjective:   1. Insulin resistance Kateline is not on medication for Insulin resistance.  2. Other hyperlipidemia Lillan is not on medication for hyperlipidemia.  Assessment/Plan:   1. Insulin resistance Shanikwa will work on lowering carbohydrates and starches. She will continue to work on weight loss, exercise, and decreasing simple carbohydrates to help decrease the risk of diabetes. Alyzon agreed to follow-up with [place] as directed to closely monitor her progress.   2. Other hyperlipidemia Kathrina will work on a better balance of omega 3 FFA to omega 6 FFA.  Cardiovascular risk and specific lipid/LDL goals reviewed.  We discussed several lifestyle modifications today and Korbyn will continue to work on diet, exercise and weight loss efforts. Orders and follow up as documented in patient record.   Counseling Intensive lifestyle modifications are the first line treatment for this issue. Dietary changes: Increase soluble fiber. Decrease simple carbohydrates. Exercise changes: Moderate to vigorous-intensity aerobic activity 150 minutes per week if tolerated. Lipid-lowering medications: see documented in medical record.   3. obesity, current BMI 30.7 Devanshi is currently in the action stage of change. As such, her goal is to continue with weight loss efforts. She has agreed to  the Category 1 Plan.   Oval will continue to meal plan. She will continue intentional eating. She will keep water intake high.  Exercise goals: Has started back walking.  Behavioral modification strategies: increasing lean protein intake, decreasing simple carbohydrates, increasing vegetables, increasing water intake, decreasing eating out, no skipping meals, meal planning and cooking strategies, keeping healthy foods in the home, and planning for success.  Siera has agreed to follow-up with our clinic in 2-3 weeks. She was informed of the importance of frequent follow-up visits to maximize her success with intensive lifestyle modifications for her multiple health conditions.   Objective:   Blood pressure 115/76, pulse 74, temperature 98.1 F (36.7 C), height '5\' 3"'$  (1.6 m), weight 173 lb (78.5 kg), SpO2 98 %. Body mass index is 30.65 kg/m.  General: Cooperative, alert, well developed, in no acute distress. HEENT: Conjunctivae and lids unremarkable. Cardiovascular: Regular rhythm.  Lungs: Normal work of breathing. Neurologic: No focal deficits.   No results found for: CREATININE, BUN, NA, K, CL, CO2 Lab Results  Component Value Date   ALT 36 06/23/2012   AST 23 06/23/2012   ALKPHOS 86 06/23/2012   BILITOT 0.4 06/23/2012   Lab Results  Component Value Date   HGBA1C 5.3 06/06/2020   Lab Results  Component Value Date   INSULIN 8.2 06/06/2020   No results found for: TSH No results found for: CHOL, HDL, LDLCALC, LDLDIRECT, TRIG, CHOLHDL Lab Results  Component Value Date   VD25OH 34.2 06/06/2020   Lab Results  Component Value Date   WBC 5.7 06/23/2012   HGB 13.5 06/23/2012   HCT 40.7 06/23/2012  MCV 95 06/23/2012   PLT 231 06/23/2012   No results found for: IRON, TIBC, FERRITIN  Obesity Behavioral Intervention:   Approximately 15 minutes were spent on the discussion below.  ASK: We discussed the diagnosis of obesity with Analayah today and Amaranta agreed to give [place]  permission to discuss obesity behavioral modification therapy today.  ASSESS: Kindsey has the diagnosis of obesity and her BMI today is 30.7. Santos is in the action stage of change.   ADVISE: Wildred was educated on the multiple health risks of obesity as well as the benefit of weight loss to improve her health. She was advised of the need for long term treatment and the importance of lifestyle modifications to improve her current health and to decrease her risk of future health problems.  AGREE: Multiple dietary modification options and treatment options were discussed and Krisha agreed to follow the recommendations documented in the above note.  ARRANGE: Reizy was educated on the importance of frequent visits to treat obesity as outlined per CMS and USPSTF guidelines and agreed to schedule her next follow up appointment today.  Attestation Statements:   Reviewed by clinician on day of visit: allergies, medications, problem list, medical history, surgical history, family history, social history, and previous encounter notes.  I, Lizbeth Bark, RMA, am acting as Location manager for [employer], DO.   I have reviewed the above documentation for accuracy and completeness, and I agree with the above. Jearld Lesch, DO

## 2020-11-20 ENCOUNTER — Other Ambulatory Visit: Payer: Self-pay

## 2020-11-20 ENCOUNTER — Ambulatory Visit
Admission: RE | Admit: 2020-11-20 | Discharge: 2020-11-20 | Disposition: A | Discharge status: Home / Self Care | Payer: PPO | Source: Ambulatory Visit | Attending: Obstetrics and Gynecology | Admitting: Obstetrics and Gynecology

## 2020-11-20 DIAGNOSIS — Z1231 Encounter for screening mammogram for malignant neoplasm of breast: Secondary | ICD-10-CM | POA: Diagnosis not present

## 2020-12-05 ENCOUNTER — Ambulatory Visit (INDEPENDENT_AMBULATORY_CARE_PROVIDER_SITE_OTHER): Payer: PPO | Admitting: Bariatrics

## 2020-12-05 ENCOUNTER — Other Ambulatory Visit: Payer: Self-pay

## 2020-12-05 ENCOUNTER — Encounter (INDEPENDENT_AMBULATORY_CARE_PROVIDER_SITE_OTHER): Payer: Self-pay | Admitting: Bariatrics

## 2020-12-05 VITALS — BP 123/76 | HR 70 | Temp 97.8°F | Ht 63.0 in | Wt 171.0 lb

## 2020-12-05 DIAGNOSIS — E559 Vitamin D deficiency, unspecified: Secondary | ICD-10-CM | POA: Diagnosis not present

## 2020-12-05 DIAGNOSIS — E7849 Other hyperlipidemia: Secondary | ICD-10-CM

## 2020-12-05 DIAGNOSIS — K219 Gastro-esophageal reflux disease without esophagitis: Secondary | ICD-10-CM

## 2020-12-05 DIAGNOSIS — Z6836 Body mass index (BMI) 36.0-36.9, adult: Secondary | ICD-10-CM | POA: Diagnosis not present

## 2020-12-05 MED ORDER — VITAMIN D (ERGOCALCIFEROL) 1.25 MG (50000 UNIT) PO CAPS: 50000.0000 [IU] | ORAL_CAPSULE | ORAL | 0 refills | Status: DC

## 2020-12-06 NOTE — Progress Notes (Signed)
Chief Complaint:   OBESITY Ann Jordan is here to discuss her progress with her obesity treatment plan along with follow-up of her obesity related diagnoses. Ann Jordan is on the Category 1 Plan and states she is following her eating plan approximately 90% of the time. Ann Jordan states she is walking for 60 minutes 5 times per week and riding a bike for 30 minutes 1 times per week.  Today's visit was #: 10 Starting weight: 205 lbs Starting date: 06/06/2020 Today's weight: 171 lbs Today's date: 12/05/2020 Total lbs lost to date: 34 lbs Total lbs lost since last in-office visit: 2 lbs  Interim History: Ann Jordan is down an additional 2 lbs and doing well overall. She is pleased with her steady loss. She is doing well with water and protein. Her goal is about 150's.  Subjective:   1. Vitamin D deficiency Ann Jordan is currently taking prescription vitamin D 50,000 IU each week. She denies nausea, vomiting or muscle weakness.  2. Other hyperlipidemia Ann Jordan is currently not taking medications.   3. Gastroesophageal reflux disease without esophagitis Ann Jordan's GERD has improved. She is currently not taking medications.  She denies nausea or vomiting.  Assessment/Plan:   1. Vitamin D deficiency Low Vitamin D level contributes to fatigue and are associated with obesity, breast, and colon cancer.We will refill prescription Vitamin D 50,000 IU every week for 1 month with no refills and Ann Jordan will follow-up for routine testing of Vitamin D, at least 2-3 times per year to avoid over-replacement.  - Vitamin D, Ergocalciferol, (DRISDOL) 1.25 MG (50000 UNIT) CAPS capsule; Take 1 capsule (50,000 Units total) by mouth every 7 (seven) days.  Dispense: 4 capsule; Refill: 0  2. Other hyperlipidemia Cardiovascular risk and specific lipid/LDL goals reviewed.  We discussed several lifestyle modifications today and Ann Jordan will continue to work on diet, exercise and weight loss efforts. Ann Jordan will have 0 trans fats, she will  decrease saturated fats. She will increase ration of Omega 3 to Omega 6. Orders and follow up as documented in patient record.   Counseling Intensive lifestyle modifications are the first line treatment for this issue. Dietary changes: Increase soluble fiber. Decrease simple carbohydrates. Exercise changes: Moderate to vigorous-intensity aerobic activity 150 minutes per week if tolerated. Lipid-lowering medications: see documented in medical record.   3. Gastroesophageal reflux disease without esophagitis Intensive lifestyle modifications are the first line treatment for this issue. We discussed several lifestyle modifications today and she will continue to work on diet, exercise and weight loss efforts. Ann Jordan will not resume medications at this time.Orders and follow up as documented in patient record.   Counseling If a person has gastroesophageal reflux disease (GERD), food and stomach acid move back up into the esophagus and cause symptoms or problems such as damage to the esophagus. Anti-reflux measures include: raising the head of the bed, avoiding tight clothing or belts, avoiding eating late at night, not lying down shortly after mealtime, and achieving weight loss. Avoid ASA, NSAID's, caffeine, alcohol, and tobacco.  OTC Pepcid and/or Tums are often very helpful for as needed use.  However, for persisting chronic or daily symptoms, stronger medications like Omeprazole may be needed. You may need to avoid foods and drinks such as: Coffee and tea (with or without caffeine). Drinks that contain alcohol. Energy drinks and sports drinks. Bubbly (carbonated) drinks or sodas. Chocolate and cocoa. Peppermint and mint flavorings. Garlic and onions. Horseradish. Spicy and acidic foods. These include peppers, chili powder, curry powder, vinegar, hot sauces, and  BBQ sauce. Citrus fruit juices and citrus fruits, such as oranges, lemons, and limes. Tomato-based foods. These include red sauce,  chili, salsa, and pizza with red sauce. Fried and fatty foods. These include donuts, french fries, potato chips, and high-fat dressings. High-fat meats. These include hot dogs, rib eye steak, sausage, ham, and bacon.   4. obesity, current BMI 30.4 Ann Jordan is currently in the action stage of change. As such, her goal is to continue with weight loss efforts. She has agreed to the Category 1 Plan.   Ann Jordan will continue meal planning. She will continue intentional eating. She will work on 70 grams of protein a day.  Exercise goals:  As is.  Behavioral modification strategies: increasing lean protein intake, decreasing simple carbohydrates, increasing vegetables, increasing water intake, decreasing eating out, no skipping meals, meal planning and cooking strategies, keeping healthy foods in the home, and planning for success.  Ann Jordan has agreed to follow-up with our clinic in 2-3 weeks. She was informed of the importance of frequent follow-up visits to maximize her success with intensive lifestyle modifications for her multiple health conditions.   Objective:   Blood pressure 123/76, pulse 70, temperature 97.8 F (36.6 C), height '5\' 3"'$  (1.6 m), weight 171 lb (77.6 kg), SpO2 99 %. Body mass index is 30.29 kg/m.  General: Cooperative, alert, well developed, in no acute distress. HEENT: Conjunctivae and lids unremarkable. Cardiovascular: Regular rhythm.  Lungs: Normal work of breathing. Neurologic: No focal deficits.   No results found for: CREATININE, BUN, NA, K, CL, CO2 Lab Results  Component Value Date   ALT 36 06/23/2012   AST 23 06/23/2012   ALKPHOS 86 06/23/2012   BILITOT 0.4 06/23/2012   Lab Results  Component Value Date   HGBA1C 5.3 06/06/2020   Lab Results  Component Value Date   INSULIN 8.2 06/06/2020   No results found for: TSH No results found for: CHOL, HDL, LDLCALC, LDLDIRECT, TRIG, CHOLHDL Lab Results  Component Value Date   VD25OH 34.2 06/06/2020   Lab Results   Component Value Date   WBC 5.7 06/23/2012   HGB 13.5 06/23/2012   HCT 40.7 06/23/2012   MCV 95 06/23/2012   PLT 231 06/23/2012   No results found for: IRON, TIBC, FERRITIN  Obesity Behavioral Intervention:   Approximately 15 minutes were spent on the discussion below.  ASK: We discussed the diagnosis of obesity with Azahria today and Makaelah agreed to give [place] permission to discuss obesity behavioral modification therapy today.  ASSESS: Varnika has the diagnosis of obesity and her BMI today is 30.4. Krithi is in the action stage of change.   ADVISE: Morene was educated on the multiple health risks of obesity as well as the benefit of weight loss to improve her health. She was advised of the need for long term treatment and the importance of lifestyle modifications to improve her current health and to decrease her risk of future health problems.  AGREE: Multiple dietary modification options and treatment options were discussed and Anora agreed to follow the recommendations documented in the above note.  ARRANGE: Jianna was educated on the importance of frequent visits to treat obesity as outlined per CMS and USPSTF guidelines and agreed to schedule her next follow up appointment today.  Attestation Statements:   Reviewed by clinician on day of visit: allergies, medications, problem list, medical history, surgical history, family history, social history, and previous encounter notes.  I, Lizbeth Bark, RMA, am acting as Location manager for [employer], DO.  I have reviewed the above documentation for accuracy and completeness, and I agree with the above. Jearld Lesch, DO

## 2020-12-08 ENCOUNTER — Encounter (INDEPENDENT_AMBULATORY_CARE_PROVIDER_SITE_OTHER): Payer: Self-pay | Admitting: Bariatrics

## 2020-12-11 DIAGNOSIS — G4733 Obstructive sleep apnea (adult) (pediatric): Secondary | ICD-10-CM | POA: Diagnosis not present

## 2020-12-27 ENCOUNTER — Other Ambulatory Visit: Payer: Self-pay

## 2020-12-27 ENCOUNTER — Ambulatory Visit (INDEPENDENT_AMBULATORY_CARE_PROVIDER_SITE_OTHER): Payer: PPO | Admitting: Bariatrics

## 2020-12-27 ENCOUNTER — Encounter (INDEPENDENT_AMBULATORY_CARE_PROVIDER_SITE_OTHER): Payer: Self-pay | Admitting: Bariatrics

## 2020-12-27 VITALS — BP 117/81 | HR 70 | Temp 97.8°F | Ht 63.0 in | Wt 171.0 lb

## 2020-12-27 DIAGNOSIS — E7849 Other hyperlipidemia: Secondary | ICD-10-CM

## 2020-12-27 DIAGNOSIS — Z6836 Body mass index (BMI) 36.0-36.9, adult: Secondary | ICD-10-CM | POA: Diagnosis not present

## 2020-12-27 DIAGNOSIS — E559 Vitamin D deficiency, unspecified: Secondary | ICD-10-CM | POA: Diagnosis not present

## 2020-12-27 MED ORDER — VITAMIN D (ERGOCALCIFEROL) 1.25 MG (50000 UNIT) PO CAPS: 50000.0000 [IU] | ORAL_CAPSULE | ORAL | 0 refills | Status: DC

## 2020-12-27 NOTE — Progress Notes (Signed)
Chief Complaint:   OBESITY Ann Jordan is here to discuss her progress with her obesity treatment plan along with follow-up of her obesity related diagnoses. Ann Jordan is on the Category 1 Plan and states she is following her eating plan approximately 98% of the time. Ann Jordan states she is walking for 60 minutes 5 times per week.  Today's visit was #: 11 Starting weight: 205 lbs Starting date: 06/06/2020 Today's weight: 171 lbs Today's date: 12/27/2020 Total lbs lost to date: 34 lbs Total lbs lost since last in-office visit: 0  Interim History: Ann Jordan weight remains the same since her last visit. She is getting adequate water. Her body water weight is up about 1.2 lbs since her last visit.  Subjective:   1. Vitamin D deficiency Ann Jordan is currently taking prescription Vitamin D.  2. Other hyperlipidemia Ann Jordan is currently not on medications.   Assessment/Plan:   1. Vitamin D deficiency Low Vitamin D level contributes to fatigue and are associated with obesity, breast, and colon cancer. We will refill prescription Vitamin D 50,000 IU every week for 1 month with no refills and Ann Jordan will follow-up for routine testing of Vitamin D, at least 2-3 times per year to avoid over-replacement.  - Vitamin D, Ergocalciferol, (DRISDOL) 1.25 MG (50000 UNIT) CAPS capsule; Take 1 capsule (50,000 Units total) by mouth every 7 (seven) days.  Dispense: 4 capsule; Refill: 0  2. Other hyperlipidemia Cardiovascular risk and specific lipid/LDL goals reviewed.  We discussed several lifestyle modifications today and Ann Jordan will continue to work on diet, exercise and weight loss efforts. Ann Jordan will decrease saturated fats. She will increase Omega 3 to Omega 6 ratio. Orders and follow up as documented in patient record.   Counseling Intensive lifestyle modifications are the first line treatment for this issue. Dietary changes: Increase soluble fiber. Decrease simple carbohydrates. Exercise changes: Moderate to  vigorous-intensity aerobic activity 150 minutes per week if tolerated. Lipid-lowering medications: see documented in medical record.   3. obesity, current BMI 30.3 Ann Jordan is currently in the action stage of change. As such, her goal is to continue with weight loss efforts. She has agreed to the Category 1 Plan.   Ann Jordan will continue meal planning. She will adhere closely to the plan.  Exercise goals:  Ann Jordan will walk 60 minutes and will add in a class with weights.  Behavioral modification strategies: increasing lean protein intake, decreasing simple carbohydrates, increasing vegetables, increasing water intake, decreasing liquid calories, decreasing eating out, no skipping meals, meal planning and cooking strategies, keeping healthy foods in the home, ways to avoid boredom eating, and planning for success.  Ann Jordan has agreed to follow-up with our clinic in 4 weeks. She was informed of the importance of frequent follow-up visits to maximize her success with intensive lifestyle modifications for her multiple health conditions.   Objective:   Blood pressure 117/81, pulse 70, temperature 97.8 F (36.6 C), height '5\' 3"'$  (1.6 m), weight 171 lb (77.6 kg), SpO2 100 %. Body mass index is 30.29 kg/m.  General: Cooperative, alert, well developed, in no acute distress. HEENT: Conjunctivae and lids unremarkable. Cardiovascular: Regular rhythm.  Lungs: Normal work of breathing. Neurologic: No focal deficits.   No results found for: CREATININE, BUN, NA, K, CL, CO2 Lab Results  Component Value Date   ALT 36 06/23/2012   AST 23 06/23/2012   ALKPHOS 86 06/23/2012   BILITOT 0.4 06/23/2012   Lab Results  Component Value Date   HGBA1C 5.3 06/06/2020   Lab Results  Component Value Date   INSULIN 8.2 06/06/2020   No results found for: TSH No results found for: CHOL, HDL, LDLCALC, LDLDIRECT, TRIG, CHOLHDL Lab Results  Component Value Date   VD25OH 34.2 06/06/2020   Lab Results  Component  Value Date   WBC 5.7 06/23/2012   HGB 13.5 06/23/2012   HCT 40.7 06/23/2012   MCV 95 06/23/2012   PLT 231 06/23/2012   No results found for: IRON, TIBC, FERRITIN  Obesity Behavioral Intervention:   Approximately 15 minutes were spent on the discussion below.  ASK: We discussed the diagnosis of obesity with Ann Jordan today and Ann Jordan agreed to give [place] permission to discuss obesity behavioral modification therapy today.  ASSESS: Ann Jordan has the diagnosis of obesity and her BMI today is 30.3. Ann Jordan is in the action stage of change.   ADVISE: Ann Jordan was educated on the multiple health risks of obesity as well as the benefit of weight loss to improve her health. She was advised of the need for long term treatment and the importance of lifestyle modifications to improve her current health and to decrease her risk of future health problems.  AGREE: Multiple dietary modification options and treatment options were discussed and Ann Jordan agreed to follow the recommendations documented in the above note.  ARRANGE: Ann Jordan was educated on the importance of frequent visits to treat obesity as outlined per CMS and USPSTF guidelines and agreed to schedule her next follow up appointment today.  Attestation Statements:   Reviewed by clinician on day of visit: allergies, medications, problem list, medical history, surgical history, family history, social history, and previous encounter notes.   I, Ann Jordan, Ann Jordan, am acting as Location manager for [employer], DO.   I have reviewed the above documentation for accuracy and completeness, and I agree with the above. Ann Lesch, DO

## 2020-12-30 ENCOUNTER — Encounter (INDEPENDENT_AMBULATORY_CARE_PROVIDER_SITE_OTHER): Payer: Self-pay | Admitting: Bariatrics

## 2021-01-03 DIAGNOSIS — H2513 Age-related nuclear cataract, bilateral: Secondary | ICD-10-CM | POA: Diagnosis not present

## 2021-01-08 ENCOUNTER — Other Ambulatory Visit: Payer: Self-pay

## 2021-01-08 ENCOUNTER — Ambulatory Visit: Payer: PPO | Admitting: Dermatology

## 2021-01-08 DIAGNOSIS — L570 Actinic keratosis: Secondary | ICD-10-CM

## 2021-01-08 DIAGNOSIS — Z1283 Encounter for screening for malignant neoplasm of skin: Secondary | ICD-10-CM

## 2021-01-08 DIAGNOSIS — L578 Other skin changes due to chronic exposure to nonionizing radiation: Secondary | ICD-10-CM | POA: Diagnosis not present

## 2021-01-08 DIAGNOSIS — Z85828 Personal history of other malignant neoplasm of skin: Secondary | ICD-10-CM | POA: Diagnosis not present

## 2021-01-08 DIAGNOSIS — L814 Other melanin hyperpigmentation: Secondary | ICD-10-CM | POA: Diagnosis not present

## 2021-01-08 DIAGNOSIS — L820 Inflamed seborrheic keratosis: Secondary | ICD-10-CM | POA: Diagnosis not present

## 2021-01-08 NOTE — Progress Notes (Signed)
Follow-Up Visit   Subjective  Ann Jordan is a 65 y.o. female who presents for the following: Follow-up (Patient here today for 6 month AK follow up. Places treated with LN2 at left nasal dorsum, right lateral canthus, right forehead at hairline and right upper lip. Patient feels like areas have cleared and is not aware of any new or changing spots. She would like her back checked. Patient had TBSE 6 months ago. ). She also has an itchy spot on her leg that she picks at.  Patient does have a hx of BCC.   The following portions of the chart were reviewed this encounter and updated as appropriate:       Review of Systems:  No other skin or systemic complaints except as noted in HPI or Assessment and Plan.  Objective  Well appearing patient in no apparent distress; mood and affect are within normal limits.  A focused examination was performed including face, neck, arms, legs, back and chest. Relevant physical exam findings are noted in the Assessment and Plan.  left lower sternum x 1, residual at right lateral canthus x 1, right lateral back at braline x 1, left upper arm x 1 (4) Erythematous thin papules/macules with gritty scale.   Right Anterior Thigh x 1 Erythematous keratotic or waxy stuck-on papule    Assessment & Plan  AK (actinic keratosis) (4) left lower sternum x 1, residual at right lateral canthus x 1, right lateral back at braline x 1, left upper arm x 1  Recheck right lateral canthus   Destruction of lesion - left lower sternum x 1, residual at right lateral canthus x 1, right lateral back at braline x 1, left upper arm x 1  Destruction method: cryotherapy   Informed consent: discussed and consent obtained   Lesion destroyed using liquid nitrogen: Yes   Region frozen until ice ball extended beyond lesion: Yes   Outcome: patient tolerated procedure well with no complications   Post-procedure details: wound care instructions given   Additional details:  Prior to  procedure, discussed risks of blister formation, small wound, skin dyspigmentation, or rare scar following cryotherapy. Recommend Vaseline ointment to treated areas while healing.   Inflamed seborrheic keratosis Right Anterior Thigh x 1  Destruction of lesion - Right Anterior Thigh x 1  Destruction method: cryotherapy   Informed consent: discussed and consent obtained   Lesion destroyed using liquid nitrogen: Yes   Region frozen until ice ball extended beyond lesion: Yes   Outcome: patient tolerated procedure well with no complications   Post-procedure details: wound care instructions given   Additional details:  Prior to procedure, discussed risks of blister formation, small wound, skin dyspigmentation, or rare scar following cryotherapy. Recommend Vaseline ointment to treated areas while healing.   Lentigines - Scattered tan macules - Due to sun exposure - Benign-appering, observe - Recommend daily broad spectrum sunscreen SPF 30+ to sun-exposed areas, reapply every 2 hours as needed. - Call for any changes  Actinic Damage - chronic, secondary to cumulative UV radiation exposure/sun exposure over time - diffuse scaly erythematous macules with underlying dyspigmentation - Recommend daily broad spectrum sunscreen SPF 30+ to sun-exposed areas, reapply every 2 hours as needed.  - Recommend staying in the shade or wearing long sleeves, sun glasses (UVA+UVB protection) and wide brim hats (4-inch brim around the entire circumference of the hat). - Call for new or changing lesions.  History of Basal Cell Carcinoma of the Skin - No evidence of  recurrence today - Recommend regular full body skin exams - Recommend daily broad spectrum sunscreen SPF 30+ to sun-exposed areas, reapply every 2 hours as needed.  - Call if any new or changing lesions are noted between office visits  Return in about 6 months (around 07/08/2021) for TBSE.  Graciella Belton, RMA, am acting as scribe for Brendolyn Patty, MD .  Documentation: I have reviewed the above documentation for accuracy and completeness, and I agree with the above.  Brendolyn Patty MD

## 2021-01-08 NOTE — Patient Instructions (Addendum)
Cryotherapy Aftercare  Wash gently with soap and water everyday.   Apply Vaseline and Band-Aid daily until healed.    Recommend daily broad spectrum sunscreen SPF 30+ to sun-exposed areas, reapply every 2 hours as needed. Call for new or changing lesions.  Staying in the shade or wearing long sleeves, sun glasses (UVA+UVB protection) and wide brim hats (4-inch brim around the entire circumference of the hat) are also recommended for sun protection.   If you have any questions or concerns for your doctor, please call our main line at [phone] and press option 4 to reach your doctor's medical assistant. If no one answers, please leave a voicemail as directed and we will return your call as soon as possible. Messages left after 4 pm will be answered the following business day.   You may also send [place] a message via [place]. We typically respond to MyChart messages within 1-2 business days.  For prescription refills, please ask your pharmacy to contact our office. Our fax number is [phone].  If you have an urgent issue when the clinic is closed that cannot wait until the next business day, you can page your doctor at the number below.    Please note that while we do our best to be available for urgent issues outside of office hours, we are not available 24/7.   If you have an urgent issue and are unable to reach [place], you may choose to seek medical care at your doctor's office, retail clinic, urgent care center, or emergency room.  If you have a medical emergency, please immediately call 911 or go to the emergency department.  Pager Numbers  - Dr. Nehemiah Massed: [phone]  - Dr. Laurence Ferrari: [phone]  - Dr. Nicole Kindred: [phone]  In the event of inclement weather, please call our main line at [phone] for an update on the status of any delays or closures.  Dermatology Medication Tips: Please keep the boxes that topical medications come in in order to help keep track of the  instructions about where and how to use these. Pharmacies typically print the medication instructions only on the boxes and not directly on the medication tubes.   If your medication is too expensive, please contact our office at [phone] option 4 or send [place] a message through [place].   We are unable to tell what your co-pay for medications will be in advance as this is different depending on your insurance coverage. However, we may be able to find a substitute medication at lower cost or fill out paperwork to get insurance to cover a needed medication.   If a prior authorization is required to get your medication covered by your insurance company, please allow [place] 1-2 business days to complete this process.  Drug prices often vary depending on where the prescription is filled and some pharmacies may offer cheaper prices.  The website www.goodrx.com contains coupons for medications through different pharmacies. The prices here do not account for what the cost may be with help from insurance (it may be cheaper with your insurance), but the website can give you the price if you did not use any insurance.  - You can print the associated coupon and take it with your prescription to the pharmacy.  - You may also stop by our office during regular business hours and pick up a GoodRx coupon card.  - If you need your prescription sent electronically to a different pharmacy, notify our office through [facility] or by phone at [phone]  option 4.

## 2021-01-24 ENCOUNTER — Encounter (INDEPENDENT_AMBULATORY_CARE_PROVIDER_SITE_OTHER): Payer: Self-pay | Admitting: Bariatrics

## 2021-01-24 ENCOUNTER — Ambulatory Visit (INDEPENDENT_AMBULATORY_CARE_PROVIDER_SITE_OTHER): Payer: PPO | Admitting: Bariatrics

## 2021-01-24 ENCOUNTER — Other Ambulatory Visit: Payer: Self-pay

## 2021-01-24 VITALS — BP 128/74 | HR 63 | Temp 97.8°F | Ht 63.0 in | Wt 167.0 lb

## 2021-01-24 DIAGNOSIS — Z6836 Body mass index (BMI) 36.0-36.9, adult: Secondary | ICD-10-CM

## 2021-01-24 DIAGNOSIS — E559 Vitamin D deficiency, unspecified: Secondary | ICD-10-CM | POA: Diagnosis not present

## 2021-01-24 DIAGNOSIS — E8881 Metabolic syndrome: Secondary | ICD-10-CM

## 2021-01-24 MED ORDER — VITAMIN D (ERGOCALCIFEROL) 1.25 MG (50000 UNIT) PO CAPS: 50000.0000 [IU] | ORAL_CAPSULE | ORAL | 0 refills | Status: DC

## 2021-01-24 NOTE — Progress Notes (Signed)
Chief Complaint:   OBESITY Ann Jordan is here to discuss her progress with her obesity treatment plan along with follow-up of her obesity related diagnoses. Ann Jordan is on the Category 1 Plan and states she is following her eating plan approximately 95% of the time. Ann Jordan states she is walking for 60 minutes 5 times per week and cardio for 60 minutes 2-3 times per week.  Today's visit was #: 12 Starting weight: 205 lbs Starting date: 06/06/2020 Today's weight: 167 lbs Today's date: 01/24/2021 Total lbs lost to date: 38 lbs Total lbs lost since last in-office visit: 4 lbs  Interim History: Ann Jordan is down an additional 4 lbs and doing well overall. She has not stayed with her plan.  Subjective:   1. Vitamin D deficiency Ann Jordan is taking her medications as directed.  2. Insulin resistance Ann Jordan is not on medications currently.   Assessment/Plan:   1. Vitamin D deficiency Low Vitamin D level contributes to fatigue and are associated with obesity, breast, and colon cancer. We will refill prescription Vitamin D 50,000 IU every week for 1 month with no refills and Ann Jordan will follow-up for routine testing of Vitamin D, at least 2-3 times per year to avoid over-replacement.  - Vitamin D, Ergocalciferol, (DRISDOL) 1.25 MG (50000 UNIT) CAPS capsule; Take 1 capsule (50,000 Units total) by mouth every 7 (seven) days.  Dispense: 4 capsule; Refill: 0  2. Insulin resistance Ann Jordan will continue to work on the plan, exercise, and decreasing simple carbohydrates to help decrease the risk of diabetes. Ann Jordan agreed to follow-up with [place] as directed to closely monitor her progress.  3. obesity, current BMI 29.6 Ann Jordan is currently in the action stage of change. As such, her goal is to continue with weight loss efforts. She has agreed to the Category 1 Plan.   Ann Jordan will continue to adhere closely to the plan.   Exercise goals:  Ann Jordan will increase exercise classes at the [facility].  Behavioral modification  strategies: increasing lean protein intake, decreasing simple carbohydrates, increasing vegetables, increasing water intake, decreasing eating out, no skipping meals, meal planning and cooking strategies, keeping healthy foods in the home, and planning for success.  Ann Jordan has agreed to follow-up with our clinic in 3 weeks (fasting). She was informed of the importance of frequent follow-up visits to maximize her success with intensive lifestyle modifications for her multiple health conditions.   Objective:   Blood pressure 128/74, pulse 63, temperature 97.8 F (36.6 C), height 5\' 3"  (1.6 m), weight 167 lb (75.8 kg), SpO2 97 %. Body mass index is 29.58 kg/m.  General: Cooperative, alert, well developed, in no acute distress. HEENT: Conjunctivae and lids unremarkable. Cardiovascular: Regular rhythm.  Lungs: Normal work of breathing. Neurologic: No focal deficits.   No results found for: CREATININE, BUN, NA, K, CL, CO2 Lab Results  Component Value Date   ALT 36 06/23/2012   AST 23 06/23/2012   ALKPHOS 86 06/23/2012   BILITOT 0.4 06/23/2012   Lab Results  Component Value Date   HGBA1C 5.3 06/06/2020   Lab Results  Component Value Date   INSULIN 8.2 06/06/2020   No results found for: TSH No results found for: CHOL, HDL, LDLCALC, LDLDIRECT, TRIG, CHOLHDL Lab Results  Component Value Date   VD25OH 34.2 06/06/2020   Lab Results  Component Value Date   WBC 5.7 06/23/2012   HGB 13.5 06/23/2012   HCT 40.7 06/23/2012   MCV 95 06/23/2012   PLT 231 06/23/2012   No  results found for: IRON, TIBC, FERRITIN  Obesity Behavioral Intervention:   Approximately 15 minutes were spent on the discussion below.  ASK: We discussed the diagnosis of obesity with Camiah today and Ann Jordan agreed to give [place] permission to discuss obesity behavioral modification therapy today.  ASSESS: Ann Jordan has the diagnosis of obesity and her BMI today is 29.6. Ann Jordan is in the action stage of change.    ADVISE: Ann Jordan was educated on the multiple health risks of obesity as well as the benefit of weight loss to improve her health. She was advised of the need for long term treatment and the importance of lifestyle modifications to improve her current health and to decrease her risk of future health problems.  AGREE: Multiple dietary modification options and treatment options were discussed and Ann Jordan agreed to follow the recommendations documented in the above note.  ARRANGE: Ann Jordan was educated on the importance of frequent visits to treat obesity as outlined per CMS and USPSTF guidelines and agreed to schedule her next follow up appointment today.  Attestation Statements:   Reviewed by clinician on day of visit: allergies, medications, problem list, medical history, surgical history, family history, social history, and previous encounter notes.  I, Ann Jordan, RMA, am acting as Location manager for [employer], DO.   I have reviewed the above documentation for accuracy and completeness, and I agree with the above. Jearld Lesch, DO

## 2021-01-28 ENCOUNTER — Encounter (INDEPENDENT_AMBULATORY_CARE_PROVIDER_SITE_OTHER): Payer: Self-pay | Admitting: Bariatrics

## 2021-02-25 ENCOUNTER — Ambulatory Visit (INDEPENDENT_AMBULATORY_CARE_PROVIDER_SITE_OTHER): Payer: PPO | Admitting: Bariatrics

## 2021-02-26 DIAGNOSIS — G4733 Obstructive sleep apnea (adult) (pediatric): Secondary | ICD-10-CM | POA: Diagnosis not present

## 2021-02-26 DIAGNOSIS — E7849 Other hyperlipidemia: Secondary | ICD-10-CM | POA: Diagnosis not present

## 2021-02-26 DIAGNOSIS — M0580 Other rheumatoid arthritis with rheumatoid factor of unspecified site: Secondary | ICD-10-CM | POA: Diagnosis not present

## 2021-02-26 LAB — BASIC METABOLIC PANEL
BUN: 20 (ref 4–21)
CO2: 31 — AB (ref 13–22)
Chloride: 106 (ref 99–108)
Glucose: 82
Potassium: 4.6 (ref 3.4–5.3)
Sodium: 143 (ref 137–147)

## 2021-02-26 LAB — HEPATIC FUNCTION PANEL
ALT: 17 (ref 7–35)
AST: 15 (ref 13–35)
Alkaline Phosphatase: 82 (ref 25–125)
Bilirubin, Total: 0.6

## 2021-02-26 LAB — TSH: TSH: 1.64 (ref 0.41–5.90)

## 2021-02-26 LAB — CBC AND DIFFERENTIAL
HCT: 42 (ref 36–46)
Hemoglobin: 13.8 (ref 12.0–16.0)
Platelets: 233 (ref 150–399)
WBC: 5.9

## 2021-02-26 LAB — CBC: RBC: 4.23 (ref 3.87–5.11)

## 2021-02-26 LAB — LIPID PANEL
Cholesterol: 232 — AB (ref 0–200)
HDL: 69 (ref 35–70)
LDL Cholesterol: 144
Triglycerides: 94 (ref 40–160)

## 2021-02-26 LAB — COMPREHENSIVE METABOLIC PANEL
Albumin: 4.2 (ref 3.5–5.0)
Calcium: 9.5 (ref 8.7–10.7)

## 2021-03-05 DIAGNOSIS — M0580 Other rheumatoid arthritis with rheumatoid factor of unspecified site: Secondary | ICD-10-CM | POA: Diagnosis not present

## 2021-03-05 DIAGNOSIS — K219 Gastro-esophageal reflux disease without esophagitis: Secondary | ICD-10-CM | POA: Diagnosis not present

## 2021-03-05 DIAGNOSIS — G4733 Obstructive sleep apnea (adult) (pediatric): Secondary | ICD-10-CM | POA: Diagnosis not present

## 2021-03-05 DIAGNOSIS — F3342 Major depressive disorder, recurrent, in full remission: Secondary | ICD-10-CM | POA: Diagnosis not present

## 2021-03-05 DIAGNOSIS — Z853 Personal history of malignant neoplasm of breast: Secondary | ICD-10-CM | POA: Diagnosis not present

## 2021-03-05 DIAGNOSIS — M3390 Dermatopolymyositis, unspecified, organ involvement unspecified: Secondary | ICD-10-CM | POA: Diagnosis not present

## 2021-03-05 DIAGNOSIS — Z23 Encounter for immunization: Secondary | ICD-10-CM | POA: Diagnosis not present

## 2021-03-05 DIAGNOSIS — E7849 Other hyperlipidemia: Secondary | ICD-10-CM | POA: Diagnosis not present

## 2021-03-11 ENCOUNTER — Other Ambulatory Visit: Payer: Self-pay

## 2021-03-11 ENCOUNTER — Ambulatory Visit (INDEPENDENT_AMBULATORY_CARE_PROVIDER_SITE_OTHER): Payer: PPO | Admitting: Family Medicine

## 2021-03-11 ENCOUNTER — Encounter (INDEPENDENT_AMBULATORY_CARE_PROVIDER_SITE_OTHER): Payer: Self-pay | Admitting: Family Medicine

## 2021-03-11 VITALS — BP 108/69 | HR 70 | Temp 98.1°F | Ht 63.0 in | Wt 166.0 lb

## 2021-03-11 DIAGNOSIS — E8881 Metabolic syndrome: Secondary | ICD-10-CM | POA: Diagnosis not present

## 2021-03-11 DIAGNOSIS — E559 Vitamin D deficiency, unspecified: Secondary | ICD-10-CM | POA: Diagnosis not present

## 2021-03-11 DIAGNOSIS — Z6836 Body mass index (BMI) 36.0-36.9, adult: Secondary | ICD-10-CM

## 2021-03-11 NOTE — Progress Notes (Signed)
Chief Complaint:   OBESITY Ann Jordan is here to discuss her progress with her obesity treatment plan along with follow-up of her obesity related diagnoses. Ann Jordan is on the Category 1 Plan and states she is following her eating plan approximately 75% of the time. Ann Jordan states she is walking and doing aerobics 60 minutes 3-5 times per week.  Today's visit was #: 32 Starting weight: 205 lbs Starting date: 06/06/2020 Today's weight: 166 lbs Today's date: 03/11/2021 Total lbs lost to date: 39 Total lbs lost since last in-office visit: 1  Interim History: Ann Jordan has had quite a few trips recently to [place], [place], and visiting her daughter. She has tried to be mindful but has less control when not home. She is going to her brother's for Thanksgiving. Pt is supposed to go to her daughter's for Thanksgiving.  Subjective:   1. Vitamin D deficiency Pt denies nausea, vomiting, and muscle weakness but notes fatigue. She is on prescription Vit D.  2. Insulin resistance Ann Jordan's last insulin level was 8.2 and insulin level 5.3. She reports occasional indulgent eating with travel.  Assessment/Plan:   1. Vitamin D deficiency Low Vitamin D level contributes to fatigue and are associated with obesity, breast, and colon cancer. She agrees to hold on taking prescription Vitamin D 50,000 IU until labs are resulted and will follow-up for routine testing of Vitamin D, at least 2-3 times per year to avoid over-replacement. Check labs today.  - VITAMIN D 25 Hydroxy (Vit-D Deficiency, Fractures)  2. Insulin resistance Ann Jordan will continue to work on weight loss, exercise, and decreasing simple carbohydrates to help decrease the risk of diabetes. Ann Jordan agreed to follow-up with [place] as directed to closely monitor her progress. Check labs today.  - Hemoglobin A1c - Insulin, random  3. Obesity, current BMI 29.4  Ann Jordan is currently in the action stage of change. As such, her goal is to continue with  weight loss efforts. She has agreed to the Category 1 Plan.   Exercise goals: All adults should avoid inactivity. Some physical activity is better than none, and adults who participate in any amount of physical activity gain some health benefits.  Behavioral modification strategies: increasing lean protein intake, travel eating strategies, holiday eating strategies , and planning for success.  Ann Jordan has agreed to follow-up with our clinic in 3-4 weeks. She was informed of the importance of frequent follow-up visits to maximize her success with intensive lifestyle modifications for her multiple health conditions.   Ann Jordan was informed we would discuss her lab results at her next visit unless there is a critical issue that needs to be addressed sooner. Ann Jordan agreed to keep her next visit at the agreed upon time to discuss these results.  Objective:   Blood pressure 108/69, pulse 70, temperature 98.1 F (36.7 C), height 5\' 3"  (1.6 m), weight 166 lb (75.3 kg), SpO2 99 %. Body mass index is 29.41 kg/m.  General: Cooperative, alert, well developed, in no acute distress. HEENT: Conjunctivae and lids unremarkable. Cardiovascular: Regular rhythm.  Lungs: Normal work of breathing. Neurologic: No focal deficits.   Lab Results  Component Value Date   BUN 20 02/26/2021   NA 143 02/26/2021   K 4.6 02/26/2021   CL 106 02/26/2021   CO2 31 (A) 02/26/2021   Lab Results  Component Value Date   ALT 17 02/26/2021   AST 15 02/26/2021   ALKPHOS 82 02/26/2021   BILITOT 0.4 06/23/2012   Lab Results  Component Value  Date   HGBA1C 5.3 06/06/2020   Lab Results  Component Value Date   INSULIN 8.2 06/06/2020   Lab Results  Component Value Date   TSH 1.64 02/26/2021   Lab Results  Component Value Date   CHOL 232 (A) 02/26/2021   HDL 69 02/26/2021   LDLCALC 144 02/26/2021   TRIG 94 02/26/2021   Lab Results  Component Value Date   VD25OH 34.2 06/06/2020   Lab Results  Component Value Date    WBC 5.9 02/26/2021   HGB 13.8 02/26/2021   HCT 42 02/26/2021   MCV 95 06/23/2012   PLT 233 02/26/2021    Attestation Statements:   Reviewed by clinician on day of visit: allergies, medications, problem list, medical history, surgical history, family history, social history, and previous encounter notes.  Coral Ceo, CMA, am acting as transcriptionist for Coralie Common, MD.   I have reviewed the above documentation for accuracy and completeness, and I agree with the above. - Coralie Common, MD

## 2021-03-12 LAB — HEMOGLOBIN A1C
Est. average glucose Bld gHb Est-mCnc: 103 mg/dL
Hgb A1c MFr Bld: 5.2 % (ref 4.8–5.6)

## 2021-03-12 LAB — INSULIN, RANDOM: INSULIN: 6.6 u[IU]/mL (ref 2.6–24.9)

## 2021-03-12 LAB — VITAMIN D 25 HYDROXY (VIT D DEFICIENCY, FRACTURES): Vit D, 25-Hydroxy: 63.2 ng/mL (ref 30.0–100.0)

## 2021-03-16 DIAGNOSIS — J101 Influenza due to other identified influenza virus with other respiratory manifestations: Secondary | ICD-10-CM | POA: Diagnosis not present

## 2021-03-16 DIAGNOSIS — R051 Acute cough: Secondary | ICD-10-CM | POA: Diagnosis not present

## 2021-03-21 ENCOUNTER — Other Ambulatory Visit (INDEPENDENT_AMBULATORY_CARE_PROVIDER_SITE_OTHER): Payer: Self-pay | Admitting: Bariatrics

## 2021-03-21 DIAGNOSIS — E559 Vitamin D deficiency, unspecified: Secondary | ICD-10-CM

## 2021-03-23 ENCOUNTER — Other Ambulatory Visit (INDEPENDENT_AMBULATORY_CARE_PROVIDER_SITE_OTHER): Payer: Self-pay | Admitting: Bariatrics

## 2021-03-23 DIAGNOSIS — J0100 Acute maxillary sinusitis, unspecified: Secondary | ICD-10-CM | POA: Diagnosis not present

## 2021-03-23 DIAGNOSIS — E559 Vitamin D deficiency, unspecified: Secondary | ICD-10-CM

## 2021-03-25 MED ORDER — VITAMIN D (ERGOCALCIFEROL) 1.25 MG (50000 UNIT) PO CAPS: 50000.0000 [IU] | ORAL_CAPSULE | ORAL | 0 refills | Status: DC

## 2021-03-25 NOTE — Telephone Encounter (Signed)
LAST APPOINTMENT DATE: 03/11/21 NEXT APPOINTMENT DATE: 04/08/21   [id number] [place], [place] [address] AT [facility] [place] [address] Phone: [phone] Fax: [phone]  Patient is requesting a refill of the following medications: Requested Prescriptions   Pending Prescriptions Disp Refills   Vitamin D, Ergocalciferol, (DRISDOL) 1.25 MG (50000 UNIT) CAPS capsule 4 capsule 0    Sig: Take 1 capsule (50,000 Units total) by mouth every 7 (seven) days.    Date last filled: 01/24/21 Previously prescribed by Dr. Owens Shark  Lab Results  Component Value Date   HGBA1C 5.2 03/11/2021   HGBA1C 5.3 06/06/2020   Lab Results  Component Value Date   LDLCALC 144 02/26/2021   Lab Results  Component Value Date   VD25OH 63.2 03/11/2021   VD25OH 34.2 06/06/2020    BP Readings from Last 3 Encounters:  03/11/21 108/69  01/24/21 128/74  12/27/20 117/81

## 2021-03-25 NOTE — Telephone Encounter (Signed)
LAST APPOINTMENT DATE: 03/11/21 NEXT APPOINTMENT DATE: 04/08/21   [id number] [place], [place] [address] AT [facility] [address] [place] [address] Phone: [phone] Fax: [phone]  Patient is requesting a refill of the following medications: Requested Prescriptions   Pending Prescriptions Disp Refills   Vitamin D, Ergocalciferol, (DRISDOL) 1.25 MG (50000 UNIT) CAPS capsule 4 capsule 0    Sig: Take 1 capsule (50,000 Units total) by mouth every 7 (seven) days.    Date last filled: 01/24/21 Previously prescribed by Dr. Owens Shark  Lab Results  Component Value Date   HGBA1C 5.2 03/11/2021   HGBA1C 5.3 06/06/2020   Lab Results  Component Value Date   LDLCALC 144 02/26/2021   Lab Results  Component Value Date   VD25OH 63.2 03/11/2021   VD25OH 34.2 06/06/2020    BP Readings from Last 3 Encounters:  03/11/21 108/69  01/24/21 128/74  12/27/20 117/81

## 2021-04-08 ENCOUNTER — Encounter (INDEPENDENT_AMBULATORY_CARE_PROVIDER_SITE_OTHER): Payer: Self-pay | Admitting: Family Medicine

## 2021-04-08 ENCOUNTER — Ambulatory Visit (INDEPENDENT_AMBULATORY_CARE_PROVIDER_SITE_OTHER): Payer: PPO | Admitting: Family Medicine

## 2021-04-08 ENCOUNTER — Other Ambulatory Visit: Payer: Self-pay

## 2021-04-08 VITALS — BP 108/68 | HR 72 | Temp 98.3°F | Ht 63.0 in | Wt 167.0 lb

## 2021-04-08 DIAGNOSIS — Z6836 Body mass index (BMI) 36.0-36.9, adult: Secondary | ICD-10-CM | POA: Diagnosis not present

## 2021-04-08 DIAGNOSIS — E8881 Metabolic syndrome: Secondary | ICD-10-CM | POA: Diagnosis not present

## 2021-04-08 DIAGNOSIS — E559 Vitamin D deficiency, unspecified: Secondary | ICD-10-CM | POA: Diagnosis not present

## 2021-04-08 NOTE — Progress Notes (Signed)
Chief Complaint:   OBESITY Ann Jordan is here to discuss her progress with her obesity treatment plan along with follow-up of her obesity related diagnoses. [name] is on the Category 1 Plan and states she is following her eating plan approximately 75% of the time. Laquandra states she is walking 60 minutes 5 times per week.  Today's visit was #: 14 Starting weight: 205 lbs Starting date: 06/06/2020 Today's weight: 167 lbs Today's date: 04/08/2021 Total lbs lost to date: 53 Total lbs lost since last in-office visit: 0  Interim History: [name] has had a great experience at clinic. She had influenza A since her last appt. She wasn't feeling well with the flu, so she was not mindful of intake. She finally started feeling better last week and pulled herself back to the plan. Now she is more back to category 1 and planning Christmas with family.  Subjective:   1. Vitamin D deficiency Discussed labs with patient today. Adlene's last Vit D level was 63.2. She is on Rx Vit D weekly.  2. Insulin resistance Discussed labs with patient today. We checked A1c and insulin at last appt and both have improved. Pt's last A1c was 5.2 with an insulin level of 6.6.  Assessment/Plan:   1. Vitamin D deficiency Low Vitamin D level contributes to fatigue and are associated with obesity, breast, and colon cancer. She agrees to discontinue prescription Vitamin D and start OTC Vit D 5,000 IU daily and will follow-up for routine testing of Vitamin D, at least 2-3 times per year to avoid over-replacement.  2. Insulin resistance [name] will continue to work on weight loss, exercise, and decreasing simple carbohydrates to help decrease the risk of diabetes. [name] agreed to follow-up with [place] as directed to closely monitor her progress. Repeat labs in 3-4 months.  3. Obesity with current BMI of 29.7  [name] is currently in the action stage of change. As such, her goal is to continue with weight loss efforts. She has  agreed to the Category 1 Plan.   Exercise goals: All adults should avoid inactivity. Some physical activity is better than none, and adults who participate in any amount of physical activity gain some health benefits. Pt is to plan starting resistance training 3-4 times a week.  Behavioral modification strategies: increasing lean protein intake, meal planning and cooking strategies, keeping healthy foods in the home, and holiday eating strategies .  [name] has agreed to follow-up with our clinic in 3-4 weeks. She was informed of the importance of frequent follow-up visits to maximize her success with intensive lifestyle modifications for her multiple health conditions.   Objective:   Blood pressure 108/68, pulse 72, temperature 98.3 F (36.8 C), height 5\' 3"  (1.6 m), weight 167 lb (75.8 kg), SpO2 99 %. Body mass index is 29.58 kg/m.  General: Cooperative, alert, well developed, in no acute distress. HEENT: Conjunctivae and lids unremarkable. Cardiovascular: Regular rhythm.  Lungs: Normal work of breathing. Neurologic: No focal deficits.   Lab Results  Component Value Date   BUN 20 02/26/2021   NA 143 02/26/2021   K 4.6 02/26/2021   CL 106 02/26/2021   CO2 31 (A) 02/26/2021   Lab Results  Component Value Date   ALT 17 02/26/2021   AST 15 02/26/2021   ALKPHOS 82 02/26/2021   BILITOT 0.4 06/23/2012   Lab Results  Component Value Date   HGBA1C 5.2 03/11/2021   HGBA1C 5.3 06/06/2020   Lab Results  Component Value Date  INSULIN 6.6 03/11/2021   INSULIN 8.2 06/06/2020   Lab Results  Component Value Date   TSH 1.64 02/26/2021   Lab Results  Component Value Date   CHOL 232 (A) 02/26/2021   HDL 69 02/26/2021   LDLCALC 144 02/26/2021   TRIG 94 02/26/2021   Lab Results  Component Value Date   VD25OH 63.2 03/11/2021   VD25OH 34.2 06/06/2020   Lab Results  Component Value Date   WBC 5.9 02/26/2021   HGB 13.8 02/26/2021   HCT 42 02/26/2021   MCV 95 06/23/2012    PLT 233 02/26/2021    Attestation Statements:   Reviewed by clinician on day of visit: allergies, medications, problem list, medical history, surgical history, family history, social history, and previous encounter notes.  Coral Ceo, CMA, am acting as transcriptionist for Coralie Common, MD.  I have reviewed the above documentation for accuracy and completeness, and I agree with the above. - Coralie Common, MD

## 2021-05-06 ENCOUNTER — Ambulatory Visit (INDEPENDENT_AMBULATORY_CARE_PROVIDER_SITE_OTHER): Payer: PPO | Admitting: Bariatrics

## 2021-05-13 ENCOUNTER — Other Ambulatory Visit: Payer: Self-pay

## 2021-05-13 ENCOUNTER — Ambulatory Visit (INDEPENDENT_AMBULATORY_CARE_PROVIDER_SITE_OTHER): Payer: PPO | Admitting: Family Medicine

## 2021-05-13 ENCOUNTER — Encounter (INDEPENDENT_AMBULATORY_CARE_PROVIDER_SITE_OTHER): Payer: Self-pay | Admitting: Family Medicine

## 2021-05-13 VITALS — BP 104/67 | HR 73 | Temp 97.9°F | Ht 63.0 in | Wt 166.0 lb

## 2021-05-13 DIAGNOSIS — E8881 Metabolic syndrome: Secondary | ICD-10-CM | POA: Diagnosis not present

## 2021-05-13 DIAGNOSIS — E669 Obesity, unspecified: Secondary | ICD-10-CM

## 2021-05-13 DIAGNOSIS — E7849 Other hyperlipidemia: Secondary | ICD-10-CM | POA: Diagnosis not present

## 2021-05-13 DIAGNOSIS — Z6829 Body mass index (BMI) 29.0-29.9, adult: Secondary | ICD-10-CM | POA: Diagnosis not present

## 2021-05-13 NOTE — Progress Notes (Signed)
Chief Complaint:   OBESITY Ann Jordan is here to discuss her progress with her obesity treatment plan along with follow-up of her obesity related diagnoses. Ann Jordan is on the Category 1 Plan and states she is following her eating plan approximately ?% of the time. Ann Jordan states she is walking or riding bike 60 minutes 5 times per week.  Today's visit was #: 15 Starting weight: 205 lbs Starting date: 06/06/2020 Today's weight: 166 lbs Today's date: 05/13/2021 Total lbs lost to date: 39 Total lbs lost since last in-office visit: 1  Interim History: Pt has a trip to [facility] for 1 week after this appt. When she returns from her trip, she has no plans for celebration or trips. Pt hasn't done much resistance training but recognizes she needs to. She wants to recommit to plan consistently after her trip.  Subjective:   1. Insulin resistance Pt's last A1c was 5.2 with an insulin level of 6.6 and she is not on meds..  2. Other hyperlipidemia Pt has an LDL of 144, HDL 69, and triglycerides 94. She is not on statin.  Assessment/Plan:   1. Insulin resistance Ann Jordan will continue to work on weight loss, exercise, and decreasing simple carbohydrates to help decrease the risk of diabetes. Ann Jordan agreed to follow-up with [place] as directed to closely monitor her progress. At goal. Continue category 1.  2. Other hyperlipidemia Cardiovascular risk and specific lipid/LDL goals reviewed.  We discussed several lifestyle modifications today and Ann Jordan will continue to work on diet, exercise and weight loss efforts. Orders and follow up as documented in patient record. Follow up labs in 2-3 months.  3. Obesity with current BMI of 29.7  Ann Jordan is currently in the action stage of change. As such, her goal is to continue with weight loss efforts. She has agreed to the Category 1 Plan.   Exercise goals: All adults should avoid inactivity. Some physical activity is better than none, and adults who participate in any  amount of physical activity gain some health benefits.  Behavioral modification strategies: increasing lean protein intake, meal planning and cooking strategies, and keeping healthy foods in the home.  Ann Jordan has agreed to follow-up with our clinic in 3 weeks. She was informed of the importance of frequent follow-up visits to maximize her success with intensive lifestyle modifications for her multiple health conditions.   Objective:   Blood pressure 104/67, pulse 73, temperature 97.9 F (36.6 C), height 5\' 3"  (1.6 m), weight 166 lb (75.3 kg), SpO2 99 %. Body mass index is 29.41 kg/m.  General: Cooperative, alert, well developed, in no acute distress. HEENT: Conjunctivae and lids unremarkable. Cardiovascular: Regular rhythm.  Lungs: Normal work of breathing. Neurologic: No focal deficits.   Lab Results  Component Value Date   BUN 20 02/26/2021   NA 143 02/26/2021   K 4.6 02/26/2021   CL 106 02/26/2021   CO2 31 (A) 02/26/2021   Lab Results  Component Value Date   ALT 17 02/26/2021   AST 15 02/26/2021   ALKPHOS 82 02/26/2021   BILITOT 0.4 06/23/2012   Lab Results  Component Value Date   HGBA1C 5.2 03/11/2021   HGBA1C 5.3 06/06/2020   Lab Results  Component Value Date   INSULIN 6.6 03/11/2021   INSULIN 8.2 06/06/2020   Lab Results  Component Value Date   TSH 1.64 02/26/2021   Lab Results  Component Value Date   CHOL 232 (A) 02/26/2021   HDL 69 02/26/2021   [place]  144 02/26/2021   TRIG 94 02/26/2021   Lab Results  Component Value Date   VD25OH 63.2 03/11/2021   VD25OH 34.2 06/06/2020   Lab Results  Component Value Date   WBC 5.9 02/26/2021   HGB 13.8 02/26/2021   HCT 42 02/26/2021   MCV 95 06/23/2012   PLT 233 02/26/2021    Attestation Statements:   Reviewed by clinician on day of visit: allergies, medications, problem list, medical history, surgical history, family history, social history, and previous encounter notes.  Coral Ceo, CMA, am  acting as transcriptionist for Coralie Common, MD.   I have reviewed the above documentation for accuracy and completeness, and I agree with the above. - Coralie Common, MD

## 2021-05-29 DIAGNOSIS — G4733 Obstructive sleep apnea (adult) (pediatric): Secondary | ICD-10-CM | POA: Diagnosis not present

## 2021-06-03 ENCOUNTER — Ambulatory Visit (INDEPENDENT_AMBULATORY_CARE_PROVIDER_SITE_OTHER): Payer: PPO | Admitting: Family Medicine

## 2021-06-04 ENCOUNTER — Other Ambulatory Visit: Payer: Self-pay | Admitting: Obstetrics and Gynecology

## 2021-06-04 DIAGNOSIS — Z1231 Encounter for screening mammogram for malignant neoplasm of breast: Secondary | ICD-10-CM

## 2021-06-04 DIAGNOSIS — Z1331 Encounter for screening for depression: Secondary | ICD-10-CM | POA: Diagnosis not present

## 2021-06-04 DIAGNOSIS — Z01419 Encounter for gynecological examination (general) (routine) without abnormal findings: Secondary | ICD-10-CM | POA: Diagnosis not present

## 2021-06-10 ENCOUNTER — Ambulatory Visit (INDEPENDENT_AMBULATORY_CARE_PROVIDER_SITE_OTHER): Payer: PPO | Admitting: Bariatrics

## 2021-06-12 ENCOUNTER — Ambulatory Visit (INDEPENDENT_AMBULATORY_CARE_PROVIDER_SITE_OTHER): Payer: PPO | Admitting: Bariatrics

## 2021-07-03 ENCOUNTER — Other Ambulatory Visit: Payer: Self-pay

## 2021-07-03 ENCOUNTER — Encounter (INDEPENDENT_AMBULATORY_CARE_PROVIDER_SITE_OTHER): Payer: Self-pay | Admitting: Bariatrics

## 2021-07-03 ENCOUNTER — Ambulatory Visit (INDEPENDENT_AMBULATORY_CARE_PROVIDER_SITE_OTHER): Payer: PPO | Admitting: Bariatrics

## 2021-07-03 VITALS — BP 131/80 | Ht 63.0 in | Wt 166.0 lb

## 2021-07-03 DIAGNOSIS — E669 Obesity, unspecified: Secondary | ICD-10-CM | POA: Diagnosis not present

## 2021-07-03 DIAGNOSIS — E559 Vitamin D deficiency, unspecified: Secondary | ICD-10-CM | POA: Diagnosis not present

## 2021-07-03 DIAGNOSIS — E7849 Other hyperlipidemia: Secondary | ICD-10-CM

## 2021-07-03 DIAGNOSIS — Z6829 Body mass index (BMI) 29.0-29.9, adult: Secondary | ICD-10-CM | POA: Diagnosis not present

## 2021-07-04 NOTE — Progress Notes (Signed)
? ? ? ?Chief Complaint:  ? ?OBESITY ?Ann Jordan is here to discuss her progress with her obesity treatment plan along with follow-up of her obesity related diagnoses. Ann Jordan is on the Category 1 Plan and states she is following her eating plan approximately 90% of the time. Ann Jordan states she is walking for 60 minutes 5 times per week and riding a bike for 30-60 minutes 5 times per week. ? ?Today's visit was #: 16 ?Starting weight: 205 lbs ?Starting date: 06/06/2020 ?Today's weight: 166 lbs ?Today's date: 07/03/2021 ?Total lbs lost to date: 39 lbs ?Total lbs lost since last in-office visit: 0 ? ?Interim History: Ann Jordan's weight remains the same. Her last office visit with me was on 02/03/2021. She struggles with eating out and tracking. Her goal is 150-155 lbs.  ? ?Subjective:  ? ?1. Other hyperlipidemia ?Ann Jordan is not on medications currently.  ? ?2. Vitamin D deficiency ?Ann Jordan is taking over the counter Vitamin D currently.  ? ?Assessment/Plan:  ? ?1. Other hyperlipidemia ?Cardiovascular risk and specific lipid/LDL goals reviewed.  Sharanya will have no trans fats. She will increase MUFAs. She will read labels. We discussed several lifestyle modifications today and Mabell will continue to work on diet, exercise and weight loss efforts. Orders and follow up as documented in patient record.  ? ?Counseling ?Intensive lifestyle modifications are the first line treatment for this issue. ?Dietary changes: Increase soluble fiber. Decrease simple carbohydrates. ?Exercise changes: Moderate to vigorous-intensity aerobic activity 150 minutes per week if tolerated. ?Lipid-lowering medications: see documented in medical record. ? ?2. Vitamin D deficiency ?Low Vitamin D level contributes to fatigue and are associated with obesity, breast, and colon cancer. Ann Jordan agrees to begin Vitamin D 2,000 IU daily and she will follow-up for routine testing of Vitamin D, at least 2-3 times per year to avoid over-replacement. ? ?3. Obesity with current BMI  of 29.5 ?Ann Jordan is currently in the action stage of change. As such, her goal is to continue with weight loss efforts. She has agreed to the Category 1 Plan.  ? ?Ann Jordan will continue meal planning and she will continue intentional eating. She will keep her protein high.  ? ?Exercise goals:  As is. ? ?Behavioral modification strategies: increasing lean protein intake, decreasing simple carbohydrates, increasing vegetables, increasing water intake, decreasing eating out, no skipping meals, meal planning and cooking strategies, keeping healthy foods in the home, and planning for success. ? ?Ann Jordan has agreed to follow-up with our clinic in 3-4 weeks (fasting). She was informed of the importance of frequent follow-up visits to maximize her success with intensive lifestyle modifications for her multiple health conditions.  ? ?Objective:  ? ?Blood pressure 131/80, height '5\' 3"'$  (1.6 m), weight 166 lb (75.3 kg). ?Body mass index is 29.41 kg/m?. ? ?General: Cooperative, alert, well developed, in no acute distress. ?HEENT: Conjunctivae and lids unremarkable. ?Cardiovascular: Regular rhythm.  ?Lungs: Normal work of breathing. ?Neurologic: No focal deficits.  ? ?Lab Results  ?Component Value Date  ? BUN 20 02/26/2021  ? NA 143 02/26/2021  ? K 4.6 02/26/2021  ? CL 106 02/26/2021  ? CO2 31 (A) 02/26/2021  ? ?Lab Results  ?Component Value Date  ? ALT 17 02/26/2021  ? AST 15 02/26/2021  ? ALKPHOS 82 02/26/2021  ? BILITOT 0.4 06/23/2012  ? ?Lab Results  ?Component Value Date  ? HGBA1C 5.2 03/11/2021  ? HGBA1C 5.3 06/06/2020  ? ?Lab Results  ?Component Value Date  ? INSULIN 6.6 03/11/2021  ? INSULIN 8.2 06/06/2020  ? ?  Lab Results  ?Component Value Date  ? TSH 1.64 02/26/2021  ? ?Lab Results  ?Component Value Date  ? CHOL 232 (A) 02/26/2021  ? HDL 69 02/26/2021  ? Maxwell 144 02/26/2021  ? TRIG 94 02/26/2021  ? ?Lab Results  ?Component Value Date  ? VD25OH 63.2 03/11/2021  ? VD25OH 34.2 06/06/2020  ? ?Lab Results  ?Component Value Date   ? WBC 5.9 02/26/2021  ? HGB 13.8 02/26/2021  ? HCT 42 02/26/2021  ? MCV 95 06/23/2012  ? PLT 233 02/26/2021  ? ?No results found for: IRON, TIBC, FERRITIN ? ?Attestation Statements:  ? ?Reviewed by clinician on day of visit: allergies, medications, problem list, medical history, surgical history, family history, social history, and previous encounter notes. ? ?I, Lizbeth Bark, RMA, am acting as transcriptionist for [employer], DO. ? ?I have reviewed the above documentation for accuracy and completeness, and I agree with the above. Jearld Lesch c ? ?

## 2021-07-08 ENCOUNTER — Encounter (INDEPENDENT_AMBULATORY_CARE_PROVIDER_SITE_OTHER): Payer: Self-pay | Admitting: Bariatrics

## 2021-07-16 ENCOUNTER — Ambulatory Visit: Payer: PPO | Admitting: Dermatology

## 2021-07-16 ENCOUNTER — Other Ambulatory Visit: Payer: Self-pay

## 2021-07-16 DIAGNOSIS — D1800 Hemangioma unspecified site: Secondary | ICD-10-CM

## 2021-07-16 DIAGNOSIS — D229 Melanocytic nevi, unspecified: Secondary | ICD-10-CM | POA: Diagnosis not present

## 2021-07-16 DIAGNOSIS — Z1283 Encounter for screening for malignant neoplasm of skin: Secondary | ICD-10-CM | POA: Diagnosis not present

## 2021-07-16 DIAGNOSIS — Z85828 Personal history of other malignant neoplasm of skin: Secondary | ICD-10-CM | POA: Diagnosis not present

## 2021-07-16 DIAGNOSIS — L570 Actinic keratosis: Secondary | ICD-10-CM

## 2021-07-16 DIAGNOSIS — Z872 Personal history of diseases of the skin and subcutaneous tissue: Secondary | ICD-10-CM

## 2021-07-16 DIAGNOSIS — L814 Other melanin hyperpigmentation: Secondary | ICD-10-CM | POA: Diagnosis not present

## 2021-07-16 DIAGNOSIS — L821 Other seborrheic keratosis: Secondary | ICD-10-CM | POA: Diagnosis not present

## 2021-07-16 DIAGNOSIS — L578 Other skin changes due to chronic exposure to nonionizing radiation: Secondary | ICD-10-CM

## 2021-07-16 DIAGNOSIS — L988 Other specified disorders of the skin and subcutaneous tissue: Secondary | ICD-10-CM

## 2021-07-16 DIAGNOSIS — L820 Inflamed seborrheic keratosis: Secondary | ICD-10-CM | POA: Diagnosis not present

## 2021-07-16 DIAGNOSIS — L719 Rosacea, unspecified: Secondary | ICD-10-CM | POA: Diagnosis not present

## 2021-07-16 MED ORDER — METRONIDAZOLE 0.75 % EX CREA: TOPICAL_CREAM | CUTANEOUS | 2 refills | Status: DC

## 2021-07-16 NOTE — Progress Notes (Signed)
? ?Follow-Up Visit ?  ?Subjective  ?Ann Jordan is a 66 y.o. female who presents for the following: Annual Exam (Patient here for full body skin exam and skin cancer screening. Patient with hx of BCC and AK's. She does have a spot at right temple that was treated previously with LN2 and still feels scaly to patient. Patient also with a few spots at hands and legs. She would like to treat any AK's with topical cream over freezing, previously has treated hands and chest with topicals.). She has several itchy spots to check as well. ? ?Patient has a spot at right side of nose, present for 1-2 weeks.  ?Patient does have hx of rosacea.  ? ?The following portions of the chart were reviewed this encounter and updated as appropriate:  ?  ?  ? ?Review of Systems:  No other skin or systemic complaints except as noted in HPI or Assessment and Plan. ? ?Objective  ?Well appearing patient in no apparent distress; mood and affect are within normal limits. ? ?A full examination was performed including scalp, head, eyes, ears, nose, lips, neck, chest, axillae, abdomen, back, buttocks, bilateral upper extremities, bilateral lower extremities, hands, feet, fingers, toes, fingernails, and toenails. All findings within normal limits unless otherwise noted below. ? ?right zygoma adjacent to hypopigmented scar x 2, mid sternum x 2, left hand dorsum x 2, left index finger x 1, left wrist x 1, right hand dorsum x 1 (9) ?Erythematous thin papules/macules with gritty scale.  ? ?face ?Light pink slightly depressed macule at right upper alar crease 1.5 mm ? ?right mid back x 1, left hand dorsum x 1, left anterior thigh x 1 (3) ?Erythematous stuck-on, waxy papule ? ?Head - Anterior (Face) ?Rhytides and volume loss.  ? ? ? ?Assessment & Plan  ?AK (actinic keratosis) (9) ?right zygoma adjacent to hypopigmented scar x 2, mid sternum x 2, left hand dorsum x 2, left index finger x 1, left wrist x 1, right hand dorsum x 1 ? ?- Start  5-fluorouracil/calcipotriene cream twice a day for 4-7 days to affected areas including hands, chest, face. Prescription sent to [facility]. Patient advised they will receive an email to purchase the medication online and have it sent to their home. Patient provided with handout reviewing treatment course and side effects and advised to call or message [place] on MyChart with any concerns. ? ?Start treatment after frozen areas have healed ? ?5-fluorouracil/calcipotriene cream is is a type of field treatment used to treat precancers, thin skin cancers, and areas of sun damage. Expected reaction includes irritation and mild inflammation potentially progressing to more severe inflammation including redness, scaling, crusting and open sores/erosions.  If too much irritation occurs, ensure application of only a thin layer and decrease frequency of use to achieve a tolerable level of inflammation. Recommend applying Vaseline ointment to open sores as needed.  Minimize sun exposure while under treatment. Recommend daily broad spectrum sunscreen SPF 30+ to sun-exposed areas, reapply every 2 hours as needed.  ? ?   ? ?Destruction of lesion - right zygoma adjacent to hypopigmented scar x 2, mid sternum x 2, left hand dorsum x 2, left index finger x 1, left wrist x 1, right hand dorsum x 1 ? ?Destruction method: cryotherapy   ?Informed consent: discussed and consent obtained   ?Lesion destroyed using liquid nitrogen: Yes   ?Region frozen until ice ball extended beyond lesion: Yes   ?Outcome: patient tolerated procedure well with  no complications   ?Post-procedure details: wound care instructions given   ?Additional details:  Prior to procedure, discussed risks of blister formation, small wound, skin dyspigmentation, or rare scar following cryotherapy. Recommend Vaseline ointment to treated areas while healing.  ? ?Rosacea ?face ? ?Resolved inflammatory papule vrs other, recheck on f/up ? ?Rosacea is a  chronic progressive skin condition usually affecting the face of adults, causing redness and/or acne bumps. It is treatable but not curable. It sometimes affects the eyes (ocular rosacea) as well. It may respond to topical and/or systemic medication and can flare with stress, sun exposure, alcohol, exercise and some foods.  Daily application of broad spectrum spf 30+ sunscreen to face is recommended to reduce flares. ? ?Start metrocream 0.75% 1-2 times daily.  ? ?metroNIDAZOLE (METROCREAM) 0.75 % cream - face ?Apply 1-2 times daily to affected areas at face. ? ?Inflamed seborrheic keratosis (3) ?right mid back x 1, left hand dorsum x 1, left anterior thigh x 1 ? ?Destruction of lesion - right mid back x 1, left hand dorsum x 1, left anterior thigh x 1 ? ?Destruction method: cryotherapy   ?Informed consent: discussed and consent obtained   ?Lesion destroyed using liquid nitrogen: Yes   ?Region frozen until ice ball extended beyond lesion: Yes   ?Outcome: patient tolerated procedure well with no complications   ?Post-procedure details: wound care instructions given   ?Additional details:  Prior to procedure, discussed risks of blister formation, small wound, skin dyspigmentation, or rare scar following cryotherapy. Recommend Vaseline ointment to treated areas while healing.  ? ?Elastosis of skin ?Head - Anterior (Face) ? ?Patient not interested in Botox or fillers. ? ?Will prescribe [facility] Anti-Aging Tretinoin 0.025%/Niacinamide/Vitamin C/Vitamin E/Turmeric/Resveratrol with Hyaluronic Acid. Apply pea sized amount nightly to the entire face.  The patient was advised this is not covered by insurance since it is made by a compounding pharmacy. They will receive an email to check out and the medication will be mailed to their home.  ? ?Topical retinoid medications like tretinoin can cause dryness and irritation when first started. Only apply a pea-sized amount to the entire affected area. Avoid applying it around  the eyes, edges of mouth and creases at the nose. If you experience irritation, use a good moisturizer first and/or apply the medicine less often. If you are doing well with the medicine, you can increase how often you use it until you are applying every night. Be careful with sun protection while using this medication as it can make you sensitive to the sun. This medicine should not be used by pregnant women.  ? ? ? ?Lentigines ?- Scattered tan macules ?- Due to sun exposure ?- Benign-appearing, observe ?- Recommend daily broad spectrum sunscreen SPF 30+ to sun-exposed areas, reapply every 2 hours as needed. ?- Call for any changes ? ?Seborrheic Keratoses ?- Stuck-on, waxy, tan-brown papules and/or plaques  ?- Benign-appearing ?- Discussed benign etiology and prognosis. ?- Observe ?- Call for any changes ? ?Melanocytic Nevi ?- Tan-brown and/or pink-flesh-colored symmetric macules and papules ?- Benign appearing on exam today ?- Observation ?- Call clinic for new or changing moles ?- Recommend daily use of broad spectrum spf 30+ sunscreen to sun-exposed areas.  ? ?Hemangiomas ?- Red papules ?- Discussed benign nature ?- Observe ?- Call for any changes ? ?Actinic Damage - Severe, confluent actinic changes with pre-cancerous actinic keratoses  ?- Severe, chronic, not at goal, secondary to cumulative UV radiation exposure over time ?- diffuse scaly erythematous  macules and papules with underlying dyspigmentation ?- Discussed Prescription "Field Treatment" for Severe, Chronic Confluent Actinic Changes with Pre-Cancerous Actinic Keratoses ?Field treatment involves treatment of an entire area of skin that has confluent Actinic Changes (Sun/ Ultraviolet light damage) and PreCancerous Actinic Keratoses by method of PhotoDynamic Therapy (PDT) and/or prescription Topical Chemotherapy agents such as 5-fluorouracil, 5-fluorouracil/calcipotriene, and/or imiquimod.  The purpose is to decrease the number of clinically evident and  subclinical PreCancerous lesions to prevent progression to development of skin cancer by chemically destroying early precancer changes that may or may not be visible.  It has been shown to reduce the risk of developing

## 2021-07-16 NOTE — Patient Instructions (Addendum)
Cryotherapy Aftercare ? ?Wash gently with soap and water everyday.   ?Apply Vaseline and Band-Aid daily until healed.  ? ?- Start 5-fluorouracil/calcipotriene cream twice a day for 4-7 days to affected areas including hands, chest, face. Prescription sent to [facility]. Patient advised they will receive an email to purchase the medication online and have it sent to their home. Patient provided with handout reviewing treatment course and side effects and advised to call or message [place] on MyChart with any concerns. ? ?Will prescribe [facility] Anti-Aging Tretinoin 0.025%/Niacinamide/Vitamin C/Vitamin E/Turmeric/Resveratrol with Hyaluronic Acid. Apply pea sized amount nightly to the entire face.  The patient was advised this is not covered by insurance since it is made by a compounding pharmacy. They will receive an email to check out and the medication will be mailed to their home.  ? ?Topical retinoid medications like tretinoin can cause dryness and irritation when first started. Only apply a pea-sized amount to the entire affected area. Avoid applying it around the eyes, edges of mouth and creases at the nose. If you experience irritation, use a good moisturizer first and/or apply the medicine less often. If you are doing well with the medicine, you can increase how often you use it until you are applying every night. Be careful with sun protection while using this medication as it can make you sensitive to the sun. This medicine should not be used by pregnant women.  ? ?Instructions for [facility] Medications ? ?One or more of your medications was sent to the [facility] mail order compounding pharmacy. You will receive an email from them and can purchase the medicine through that link. It will then be mailed to your home at the address you confirmed. If for any reason you do not receive an email from them, please check your spam folder. If you still do not find the email,  please let us know. [facility] phone number is [phone]. ? ?Melanoma ABCDEs ? ?Melanoma is the most dangerous type of skin cancer, and is the leading cause of death from skin disease.  You are more likely to develop melanoma if you: ?Have light-colored skin, light-colored eyes, or red or blond hair ?Spend a lot of time in the sun ?Tan regularly, either outdoors or in a tanning bed ?Have had blistering sunburns, especially during childhood ?Have a close family member who has had a melanoma ?Have atypical moles or large birthmarks ? ?Early detection of melanoma is key since treatment is typically straightforward and cure rates are extremely high if we catch it early.  ? ?The first sign of melanoma is often a change in a mole or a new dark spot.  The ABCDE system is a way of remembering the signs of melanoma. ? ?A for asymmetry:  The two halves do not match. ?B for border:  The edges of the growth are irregular. ?C for color:  A mixture of colors are present instead of an even brown color. ?D for diameter:  Melanomas are usually (but not always) greater than 54m - the size of a pencil eraser. ?E for evolution:  The spot keeps changing in size, shape, and color. ? ?Please check your skin once per month between visits. You can use a small mirror in front and a large mirror behind you to keep an eye on the back side or your body.  ? ?If you see any new or changing lesions before your next follow-up, please call to schedule a visit. ? ?  Please continue daily skin protection including broad spectrum sunscreen SPF 30+ to sun-exposed areas, reapplying every 2 hours as needed when you're outdoors.   ? ?If You Need Anything After Your Visit ? ?If you have any questions or concerns for your doctor, please call our main line at [phone] and press option 4 to reach your doctor's medical assistant. If no one answers, please leave a voicemail as directed and we will return your call as soon as possible. Messages left  after 4 pm will be answered the following business day.  ? ?You may also send [place] a message via MyChart. We typically respond to MyChart messages within 1-2 business days. ? ?For prescription refills, please ask your pharmacy to contact our office. Our fax number is [phone]. ? ?If you have an urgent issue when the clinic is closed that cannot wait until the next business day, you can page your doctor at the number below.   ? ?Please note that while we do our best to be available for urgent issues outside of office hours, we are not available 24/7.  ? ?If you have an urgent issue and are unable to reach [place], you may choose to seek medical care at your doctor's office, retail clinic, urgent care center, or emergency room. ? ?If you have a medical emergency, please immediately call 911 or go to the emergency department. ? ?Pager Numbers ? ?- Dr. Nehemiah Massed: [phone] ? ?- Dr. Laurence Ferrari: [phone] ? ?- Dr. Nicole Kindred: [phone] ? ?In the event of inclement weather, please call our main line at [phone] for an update on the status of any delays or closures. ? ?Dermatology Medication Tips: ?Please keep the boxes that topical medications come in in order to help keep track of the instructions about where and how to use these. Pharmacies typically print the medication instructions only on the boxes and not directly on the medication tubes.  ? ?If your medication is too expensive, please contact our office at [phone] option 4 or send [place] a message through [place].  ? ?We are unable to tell what your co-pay for medications will be in advance as this is different depending on your insurance coverage. However, we may be able to find a substitute medication at lower cost or fill out paperwork to get insurance to cover a needed medication.  ? ?If a prior authorization is required to get your medication covered by your insurance company, please allow [place] 1-2 business days to complete this process. ? ?Drug prices often  vary depending on where the prescription is filled and some pharmacies may offer cheaper prices. ? ?The website www.goodrx.com contains coupons for medications through different pharmacies. The prices here do not account for what the cost may be with help from insurance (it may be cheaper with your insurance), but the website can give you the price if you did not use any insurance.  ?- You can print the associated coupon and take it with your prescription to the pharmacy.  ?- You may also stop by our office during regular business hours and pick up a GoodRx coupon card.  ?- If you need your prescription sent electronically to a different pharmacy, notify our office through [facility] or by phone at [phone] option 4. ? ? ? ? ?Si Usted Necesita Algo Despu?s de Su Visita ? ?Tambi?n puede enviarnos un mensaje a trav?s de MyChart. Por lo general respondemos a los mensajes de MyChart en el transcurso de 1 a 2 d?as h?biles. ? ?  Para renovar recetas, por favor pida a su farmacia que se ponga en contacto con nuestra oficina. Nuestro n?mero de fax es el [phone]. ? ?Si tiene un asunto urgente cuando la cl?nica est? cerrada y que no puede esperar hasta el siguiente d?a h?bil, puede llamar/localizar a su doctor(a) al n?mero que aparece a continuaci?n.  ? ?Por favor, tenga en cuenta que aunque hacemos todo lo posible para estar disponibles para asuntos urgentes fuera del horario de oficina, no estamos disponibles las 24 horas del d?a, los 7 d?as de la semana.  ? ?Si tiene un problema urgente y no puede comunicarse con nosotros, puede optar por buscar atenci?n m?dica  en el consultorio de su doctor(a), en una cl?nica privada, en un centro de atenci?n urgente o en una sala de emergencias. ? ?Si tiene Engineer, maintenance (IT) m?dica, por favor llame inmediatamente al 911 o vaya a la sala de emergencias. ? ?N?meros de b?per ? ?- Dr. Nehemiah Massed: [phone] ? ?- Dra. Moye: [phone] ? ?- Dra. Nicole Kindred: [phone] ? ?En caso  de inclemencias del tiempo, por favor llame a nuestra l?nea principal al [phone] para una actualizaci?n sobre el estado de cualquier retraso o cierre. ? ?Consejos para la medicaci?n en dermatolog?a: ?Por

## 2021-08-12 ENCOUNTER — Ambulatory Visit (INDEPENDENT_AMBULATORY_CARE_PROVIDER_SITE_OTHER): Payer: PPO | Admitting: Bariatrics

## 2021-08-12 ENCOUNTER — Encounter (INDEPENDENT_AMBULATORY_CARE_PROVIDER_SITE_OTHER): Payer: Self-pay | Admitting: Bariatrics

## 2021-08-12 VITALS — BP 109/71 | HR 61 | Temp 97.5°F | Ht 63.0 in | Wt 170.0 lb

## 2021-08-12 DIAGNOSIS — R0602 Shortness of breath: Secondary | ICD-10-CM | POA: Insufficient documentation

## 2021-08-12 DIAGNOSIS — E669 Obesity, unspecified: Secondary | ICD-10-CM | POA: Diagnosis not present

## 2021-08-12 DIAGNOSIS — Z6836 Body mass index (BMI) 36.0-36.9, adult: Secondary | ICD-10-CM

## 2021-08-12 DIAGNOSIS — Z6830 Body mass index (BMI) 30.0-30.9, adult: Secondary | ICD-10-CM

## 2021-08-12 DIAGNOSIS — R5383 Other fatigue: Secondary | ICD-10-CM | POA: Diagnosis not present

## 2021-08-12 DIAGNOSIS — E7849 Other hyperlipidemia: Secondary | ICD-10-CM

## 2021-08-19 NOTE — Progress Notes (Signed)
? ? ? ? ?Chief Complaint:  ? ?OBESITY ?Ann Jordan is here to discuss her progress with her obesity treatment plan along with follow-up of her obesity related diagnoses. Miyeko is on the Category 1 Plan and states she is following her eating plan approximately 85-90% of the time. Hendy states she is walking 60 minutes 5 times per week. ? ?Today's visit was #: 29 ?Starting weight: 205 lbs ?Starting date: 06/06/2020 ?Today's weight: 170 lbs ?Today's date: 08/19/2021 ?Total lbs lost to date: 35 lbs ?Total lbs lost since last in-office visit: 0 lb ? ?Interim History: Myiah is up 4 lbs since her last visit.  She has been to the beach  increased her seafood and salt). Her water weight is up and muscle mass is higher. ? ?Subjective:  ? ?1. Other fatigue ?Today's REE = 1411, previous REE was 1254 ? ?2. SOBOE (shortness of breath on exertion) ?Today's REE = 1411, previous REE was 1254 ? ?3. Other hyperlipidemia ?Akacia is not currently taking medication. Andelyn has hyperlipidemia and has been trying to improve her cholesterol levels with intensive lifestyle modifications including a low saturated fat diet, exercise, and weight loss. She denies any chest pain, claudication, or myalgias. ? ?Assessment/Plan:  ? ?1. Other fatigue ?Review IC test in detail. Her IC is improving, but still not at goal.  ? ?2. SOBOE (shortness of breath on exertion) ?Review IC test in detail ( implications for weight loss and weight regain ).  ? ?3. Other hyperlipidemia ?Deidre Ala agreed to no trans fat and will minimize saturated fats except diary and unprocessed meats.  ? ?4. Obesity, current BMI 30.1 ?Sakara is currently in the action stage of change. As such, her goal is to continue with weight loss efforts. She has agreed to the Category 1 Plan.  ? ?Cyani will adhere closely to the plan. She will work on meal planning and increase her water intake. ? ?Exercise goals: start bike riding. ? ?Behavioral modification strategies: increasing lean protein intake,  decreasing simple carbohydrates, increasing vegetables, increasing water intake, decreasing eating out, no skipping meals, meal planning and cooking strategies, keeping healthy foods in the home, and planning for success. ? ?Lou has agreed to follow-up with our clinic in 4 weeks. She was informed of the importance of frequent follow-up visits to maximize her success with intensive lifestyle modifications for her multiple health conditions.  ? ?Objective:  ? ?Blood pressure 109/71, pulse 61, temperature (!) 97.5 ?F (36.4 ?C), height '5\' 3"'$  (1.6 m), weight 170 lb (77.1 kg), SpO2 98 %. ?Body mass index is 30.11 kg/m?. ? ?General: Cooperative, alert, well developed, in no acute distress. ?HEENT: Conjunctivae and lids unremarkable. ?Cardiovascular: Regular rhythm.  ?Lungs: Normal work of breathing. ?Neurologic: No focal deficits.  ? ?Lab Results  ?Component Value Date  ? BUN 20 02/26/2021  ? NA 143 02/26/2021  ? K 4.6 02/26/2021  ? CL 106 02/26/2021  ? CO2 31 (A) 02/26/2021  ? ?Lab Results  ?Component Value Date  ? ALT 17 02/26/2021  ? AST 15 02/26/2021  ? ALKPHOS 82 02/26/2021  ? BILITOT 0.4 06/23/2012  ? ?Lab Results  ?Component Value Date  ? HGBA1C 5.2 03/11/2021  ? HGBA1C 5.3 06/06/2020  ? ?Lab Results  ?Component Value Date  ? INSULIN 6.6 03/11/2021  ? INSULIN 8.2 06/06/2020  ? ?Lab Results  ?Component Value Date  ? TSH 1.64 02/26/2021  ? ?Lab Results  ?Component Value Date  ? CHOL 232 (A) 02/26/2021  ? HDL 69 02/26/2021  ?  [place] 144 02/26/2021  ? TRIG 94 02/26/2021  ? ?Lab Results  ?Component Value Date  ? VD25OH 63.2 03/11/2021  ? VD25OH 34.2 06/06/2020  ? ?Lab Results  ?Component Value Date  ? WBC 5.9 02/26/2021  ? HGB 13.8 02/26/2021  ? HCT 42 02/26/2021  ? MCV 95 06/23/2012  ? PLT 233 02/26/2021  ? ?No results found for: IRON, TIBC, FERRITIN ? ?Obesity Behavioral Intervention:  ? ?Approximately 15 minutes were spent on the discussion below. ? ?ASK: ?We discussed the diagnosis of obesity with Arlie today and  Margrette agreed to give [place] permission to discuss obesity behavioral modification therapy today. ? ?ASSESS: ?Misako has the diagnosis of obesity and her BMI today is 30.1. Lakeesha is in the action stage of change.  ? ?ADVISE: ?Ann-Marie was educated on the multiple health risks of obesity as well as the benefit of weight loss to improve her health. She was advised of the need for long term treatment and the importance of lifestyle modifications to improve her current health and to decrease her risk of future health problems. ? ?AGREE: ?Multiple dietary modification options and treatment options were discussed and Aylah agreed to follow the recommendations documented in the above note. ? ?ARRANGE: ?Cleola was educated on the importance of frequent visits to treat obesity as outlined per CMS and USPSTF guidelines and agreed to schedule her next follow up appointment today. ? ?Attestation Statements:  ? ?Reviewed by clinician on day of visit: allergies, medications, problem list, medical history, surgical history, family history, social history, and previous encounter notes. ? ?Time spent on visit including pre-visit chart review and post-visit care and charting was 30 minutes.  ? ?I, Lennette Bihari, CMA, am acting as transcriptionist for Dr. Jearld Lesch, DO. ? ?I have reviewed the above documentation for accuracy and completeness, and I agree with the above. Jearld Lesch, DO ? ?

## 2021-08-21 ENCOUNTER — Encounter (INDEPENDENT_AMBULATORY_CARE_PROVIDER_SITE_OTHER): Payer: Self-pay | Admitting: Bariatrics

## 2021-08-27 ENCOUNTER — Ambulatory Visit (INDEPENDENT_AMBULATORY_CARE_PROVIDER_SITE_OTHER): Payer: PPO | Admitting: Bariatrics

## 2021-08-27 ENCOUNTER — Encounter (INDEPENDENT_AMBULATORY_CARE_PROVIDER_SITE_OTHER): Payer: Self-pay | Admitting: Bariatrics

## 2021-08-27 VITALS — BP 130/81 | HR 75 | Temp 98.0°F | Ht 63.0 in | Wt 161.0 lb

## 2021-08-27 DIAGNOSIS — E669 Obesity, unspecified: Secondary | ICD-10-CM

## 2021-08-27 DIAGNOSIS — K219 Gastro-esophageal reflux disease without esophagitis: Secondary | ICD-10-CM

## 2021-08-27 DIAGNOSIS — E7849 Other hyperlipidemia: Secondary | ICD-10-CM

## 2021-08-27 DIAGNOSIS — Z6828 Body mass index (BMI) 28.0-28.9, adult: Secondary | ICD-10-CM | POA: Diagnosis not present

## 2021-08-28 ENCOUNTER — Ambulatory Visit: Payer: PPO | Admitting: Dermatology

## 2021-08-28 DIAGNOSIS — S20161A Insect bite (nonvenomous) of breast, right breast, initial encounter: Secondary | ICD-10-CM | POA: Diagnosis not present

## 2021-08-28 DIAGNOSIS — D489 Neoplasm of uncertain behavior, unspecified: Secondary | ICD-10-CM

## 2021-08-28 DIAGNOSIS — Z853 Personal history of malignant neoplasm of breast: Secondary | ICD-10-CM

## 2021-08-28 DIAGNOSIS — Z85828 Personal history of other malignant neoplasm of skin: Secondary | ICD-10-CM | POA: Diagnosis not present

## 2021-08-28 DIAGNOSIS — L570 Actinic keratosis: Secondary | ICD-10-CM

## 2021-08-28 DIAGNOSIS — C44311 Basal cell carcinoma of skin of nose: Secondary | ICD-10-CM | POA: Diagnosis not present

## 2021-08-28 DIAGNOSIS — W57XXXA Bitten or stung by nonvenomous insect and other nonvenomous arthropods, initial encounter: Secondary | ICD-10-CM | POA: Diagnosis not present

## 2021-08-28 DIAGNOSIS — L821 Other seborrheic keratosis: Secondary | ICD-10-CM | POA: Diagnosis not present

## 2021-08-28 DIAGNOSIS — L578 Other skin changes due to chronic exposure to nonionizing radiation: Secondary | ICD-10-CM

## 2021-08-28 MED ORDER — MOMETASONE FUROATE 0.1 % EX CREA: TOPICAL_CREAM | CUTANEOUS | 1 refills | Status: DC

## 2021-08-28 NOTE — Progress Notes (Signed)
? ?Follow-Up Visit ?  ?Subjective  ?Ann Jordan is a 66 y.o. female who presents for the following: Follow-up (Patient reports a crusty spot at right side of nose that bleeds and a possible bug bite at right breast she developed after working in yard on Saturday. Pt reports hx of skin cancer and breast cancer in left breast. ). ? ?The patient has spots, moles and lesions to be evaluated, some may be new or changing and the patient has concerns that these could be cancer. ? ? ?The following portions of the chart were reviewed this encounter and updated as appropriate:   ?  ? ?Review of Systems: No other skin or systemic complaints except as noted in HPI or Assessment and Plan. ? ? ?Objective  ?Well appearing patient in no apparent distress; mood and affect are within normal limits. ? ?A focused examination was performed including face, right breast. Relevant physical exam findings are noted in the Assessment and Plan. ? ?Right Alar Crease ?4 x 2 mm crusted macule  ? ? ? ? ? ? ?Right Breast ?8 cm pink patch with central edmateous papule x 2 ? ? ?lefl chest x 1 ?Erythematous thin papules/macules with gritty scale.  ? ? ?Assessment & Plan  ?Neoplasm of uncertain behavior ?Right Alar Crease ? ?Epidermal / dermal shaving ? ?Lesion diameter (cm):  0.4 ?Informed consent: discussed and consent obtained   ?Patient was prepped and draped in usual sterile fashion: Area prepped with alcohol. ?Anesthesia: the lesion was anesthetized in a standard fashion   ?Anesthetic:  1% lidocaine w/ epinephrine 1-100,000 buffered w/ 8.4% NaHCO3 ?Instrument used: flexible razor blade   ?Hemostasis achieved with: pressure, aluminum chloride and electrodesiccation   ?Outcome: patient tolerated procedure well   ?Post-procedure details: wound care instructions given   ?Post-procedure details comment:  Ointment and small bandage applied.  ?Additional details:  0.6 cm post treatment defect  ? ?Specimen 1 - Surgical pathology ?Differential  Diagnosis: R/o ak vs bcc ? ?Check Margins: No ? ?R/o Ak vs bcc ? ?Bug bite without infection, initial encounter ?Right Breast ? ?Developed after working in yard Saturday ? ?Favor bite reaction  ? ?Start mometasone apply topically qd to bid prn for rash at right breast until clear. ? ?Topical steroids (such as triamcinolone, fluocinolone, fluocinonide, mometasone, clobetasol, halobetasol, betamethasone, hydrocortisone) can cause thinning and lightening of the skin if they are used for too long in the same area. Your physician has selected the right strength medicine for your problem and area affected on the body. Please use your medication only as directed by your physician to prevent side effects.  ? ? ?mometasone (ELOCON) 0.1 % cream - Right Breast ?Apply to rash at right breast qd to bid prn until clear ? ?Actinic keratosis ?lefl chest x 1 ? ?Hypertrophic ak at left chest ? ?Actinic keratoses are precancerous spots that appear secondary to cumulative UV radiation exposure/sun exposure over time. They are chronic with expected duration over 1 year. A portion of actinic keratoses will progress to squamous cell carcinoma of the skin. It is not possible to reliably predict which spots will progress to skin cancer and so treatment is recommended to prevent development of skin cancer. ? ?Recommend daily broad spectrum sunscreen SPF 30+ to sun-exposed areas, reapply every 2 hours as needed.  ?Recommend staying in the shade or wearing long sleeves, sun glasses (UVA+UVB protection) and wide brim hats (4-inch brim around the entire circumference of the hat). ?Call for new or changing lesions. ? ?  Destruction of lesion - lefl chest x 1 ? ?Destruction method: cryotherapy   ?Informed consent: discussed and consent obtained   ?Lesion destroyed using liquid nitrogen: Yes   ?Region frozen until ice ball extended beyond lesion: Yes   ?Outcome: patient tolerated procedure well with no complications   ?Post-procedure details: wound  care instructions given   ?Additional details:  Prior to procedure, discussed risks of blister formation, small wound, skin dyspigmentation, or rare scar following cryotherapy. Recommend Vaseline ointment to treated areas while healing. ? ? ? ?Seborrheic Keratoses ?- Stuck-on, waxy, tan-brown papules and/or plaques  ?- Benign-appearing ?- Discussed benign etiology and prognosis. ?- Observe ?- Call for any changes ? ?Actinic Damage at chest and hands ? - Severe, confluent actinic changes with pre-cancerous actinic keratoses  ?- Severe, chronic, not at goal, secondary to cumulative UV radiation exposure over time ?- diffuse scaly erythematous macules and papules with underlying dyspigmentation ?- Discussed Prescription "Field Treatment" for Severe, Chronic Confluent Actinic Changes with Pre-Cancerous Actinic Keratoses ?Field treatment involves treatment of an entire area of skin that has confluent Actinic Changes (Sun/ Ultraviolet light damage) and PreCancerous Actinic Keratoses by method of PhotoDynamic Therapy (PDT) and/or prescription Topical Chemotherapy agents such as 5-fluorouracil, 5-fluorouracil/calcipotriene, and/or imiquimod.  The purpose is to decrease the number of clinically evident and subclinical PreCancerous lesions to prevent progression to development of skin cancer by chemically destroying early precancer changes that may or may not be visible.  It has been shown to reduce the risk of developing skin cancer in the treated area. As a result of treatment, redness, scaling, crusting, and open sores may occur during treatment course. One or more than one of these methods may be used and may have to be used several times to control, suppress and eliminate the PreCancerous changes. Discussed treatment course, expected reaction, and possible side effects. ?- Recommend daily broad spectrum sunscreen SPF 30+ to sun-exposed areas, reapply every 2 hours as needed.  ?- Staying in the shade or wearing long  sleeves, sun glasses (UVA+UVB protection) and wide brim hats (4-inch brim around the entire circumference of the hat) are also recommended. ?- Call for new or changing lesions. ?Restart in the next several weeks. 5-fluorouracil/calcipotriene cream twice a day for 4-7  days to affected areas including hands, chest, face.  ? ?Return if symptoms worsen or fail to improve, for keep follow up as schedule 07/2022. ?I, Ruthell Rummage, CMA, am acting as scribe for Brendolyn Patty, MD. ? ?

## 2021-08-28 NOTE — Patient Instructions (Addendum)
Reapply in a couple weeks ? ?Start 5-fluorouracil/calcipotriene cream twice a day for 4 - 7 days to affected areas including chest, hands.  ? ? ?Actinic keratoses are precancerous spots that appear secondary to cumulative UV radiation exposure/sun exposure over time. They are chronic with expected duration over 1 year. A portion of actinic keratoses will progress to squamous cell carcinoma of the skin. It is not possible to reliably predict which spots will progress to skin cancer and so treatment is recommended to prevent development of skin cancer. ? ?Recommend daily broad spectrum sunscreen SPF 30+ to sun-exposed areas, reapply every 2 hours as needed.  ?Recommend staying in the shade or wearing long sleeves, sun glasses (UVA+UVB protection) and wide brim hats (4-inch brim around the entire circumference of the hat). ?Call for new or changing lesions.  ? ?Cryotherapy Aftercare ? ?Wash gently with soap and water everyday.   ?Apply Vaseline and Band-Aid daily until healed.  ? ? ?Biopsy Wound Care Instructions ? ?Leave the original bandage on for 24 hours if possible.  If the bandage becomes soaked or soiled before that time, it is OK to remove it and examine the wound.  A small amount of post-operative bleeding is normal.  If excessive bleeding occurs, remove the bandage, place gauze over the site and apply continuous pressure (no peeking) over the area for 30 minutes. If this does not work, please call our clinic as soon as possible or page your doctor if it is after hours.  ? ?Once a day, cleanse the wound with soap and water. It is fine to shower. If a thick crust develops you may use a Q-tip dipped into dilute hydrogen peroxide (mix 1:1 with water) to dissolve it.  Hydrogen peroxide can slow the healing process, so use it only as needed.   ? ?After washing, apply petroleum jelly (Vaseline) or an antibiotic ointment if your doctor prescribed one for you, followed by a bandage.   ? ?For best healing, the wound  should be covered with a layer of ointment at all times. If you are not able to keep the area covered with a bandage to hold the ointment in place, this may mean re-applying the ointment several times a day.  Continue this wound care until the wound has healed and is no longer open.  ? ?Itching and mild discomfort is normal during the healing process. However, if you develop pain or severe itching, please call our office.  ? ?If you have any discomfort, you can take Tylenol (acetaminophen) or ibuprofen as directed on the bottle. (Please do not take these if you have an allergy to them or cannot take them for another reason). ? ?Some redness, tenderness and white or yellow material in the wound is normal healing.  If the area becomes very sore and red, or develops a thick yellow-green material (pus), it may be infected; please notify us.   ? ?If you have stitches, return to clinic as directed to have the stitches removed. You will continue wound care for 2-3 days after the stitches are removed.  ? ?Wound healing continues for up to one year following surgery. It is not unusual to experience pain in the scar from time to time during the interval.  If the pain becomes severe or the scar thickens, you should notify the office.   ? ?A slight amount of redness in a scar is expected for the first six months.  After six months, the redness will fade and the scar  will soften and fade.  The color difference becomes less noticeable with time.  If there are any problems, return for a post-op surgery check at your earliest convenience. ? ?To improve the appearance of the scar, you can use silicone scar gel, cream, or sheets (such as Mederma or Serica) every night for up to one year. These are available over the counter (without a prescription). ? ?Please call our office at [phone] for any questions or concerns. ? ? ? ?If You Need Anything After Your Visit ? ?If you have any questions or concerns for your doctor, please  call our main line at [phone] and press option 4 to reach your doctor's medical assistant. If no one answers, please leave a voicemail as directed and we will return your call as soon as possible. Messages left after 4 pm will be answered the following business day.  ? ?You may also send [place] a message via MyChart. We typically respond to MyChart messages within 1-2 business days. ? ?For prescription refills, please ask your pharmacy to contact our office. Our fax number is [phone]. ? ?If you have an urgent issue when the clinic is closed that cannot wait until the next business day, you can page your doctor at the number below.   ? ?Please note that while we do our best to be available for urgent issues outside of office hours, we are not available 24/7.  ? ?If you have an urgent issue and are unable to reach [place], you may choose to seek medical care at your doctor's office, retail clinic, urgent care center, or emergency room. ? ?If you have a medical emergency, please immediately call 911 or go to the emergency department. ? ?Pager Numbers ? ?- Dr. Nehemiah Massed: [phone] ? ?- Dr. Laurence Ferrari: [phone] ? ?- Dr. Nicole Kindred: [phone] ? ?In the event of inclement weather, please call our main line at [phone] for an update on the status of any delays or closures. ? ?Dermatology Medication Tips: ?Please keep the boxes that topical medications come in in order to help keep track of the instructions about where and how to use these. Pharmacies typically print the medication instructions only on the boxes and not directly on the medication tubes.  ? ?If your medication is too expensive, please contact our office at [phone] option 4 or send [place] a message through [place].  ? ?We are unable to tell what your co-pay for medications will be in advance as this is different depending on your insurance coverage. However, we may be able to find a substitute medication at lower cost or fill out paperwork to get  insurance to cover a needed medication.  ? ?If a prior authorization is required to get your medication covered by your insurance company, please allow [place] 1-2 business days to complete this process. ? ?Drug prices often vary depending on where the prescription is filled and some pharmacies may offer cheaper prices. ? ?The website www.goodrx.com contains coupons for medications through different pharmacies. The prices here do not account for what the cost may be with help from insurance (it may be cheaper with your insurance), but the website can give you the price if you did not use any insurance.  ?- You can print the associated coupon and take it with your prescription to the pharmacy.  ?- You may also stop by our office during regular business hours and pick up a GoodRx coupon card.  ?- If you need your prescription sent electronically to a different  pharmacy, notify our office through [facility] or by phone at [phone] option 4. ? ? ? ? ?Si Usted Necesita Algo Despu?s de Su Visita ? ?Tambi?n puede enviarnos un mensaje a trav?s de MyChart. Por lo general respondemos a los mensajes de MyChart en el transcurso de 1 a 2 d?as h?biles. ? ?Para renovar recetas, por favor pida a su farmacia que se ponga en contacto con nuestra oficina. Nuestro n?mero de fax es el [phone]. ? ?Si tiene un asunto urgente cuando la cl?nica est? cerrada y que no puede esperar hasta el siguiente d?a h?bil, puede llamar/localizar a su doctor(a) al n?mero que aparece a continuaci?n.  ? ?Por favor, tenga en cuenta que aunque hacemos todo lo posible para estar disponibles para asuntos urgentes fuera del horario de oficina, no estamos disponibles las 24 horas del d?a, los 7 d?as de la semana.  ? ?Si tiene un problema urgente y no puede comunicarse con nosotros, puede optar por buscar atenci?n m?dica  en el consultorio de su doctor(a), en una cl?nica privada, en un centro de atenci?n urgente o en una sala de emergencias. ? ?Si  tiene Engineer, maintenance (IT) m?dica, por favor llame inmediatamente al 911 o vaya a la sala de emergencias. ? ?N?meros de b?per ? ?- Dr. Nehemiah Massed: [phone] ? ?- Dra. Moye: [phone] ? ?- Dra. Nicole Kindred: [phone] ?

## 2021-08-28 NOTE — Progress Notes (Signed)
? ?Follow-Up Visit ?  ?Subjective  ?Ann Jordan is a 66 y.o. female who presents for the following: Follow-up (Patient reports a crusty spot at right side of nose that bleeds and a possible bug bite at right breast she developed after working in yard on Saturday. Pt reports hx of skin cancer and breast cancer in left breast. ). ? ?The patient has spots, moles and lesions to be evaluated, some may be new or changing and the patient has concerns that these could be cancer. ? ? ?The following portions of the chart were reviewed this encounter and updated as appropriate:   ?  ? ?Review of Systems: No other skin or systemic complaints. ? ?Objective  ?Well appearing patient in no apparent distress; mood and affect are within normal limits. ? ?A focused examination was performed including face, chest. Relevant physical exam findings are noted in the Assessment and Plan. ? ?Right Alar Crease ?4 x 2 mm crusted macule  ? ? ? ? ? ? ?Right Breast ?8 cm pink patch with central edmateous papule x 2 ? ? ?lefl chest x 1 ?Pink scaly papule L chest, rest of chest is clear, hands clear ? ? ?Assessment & Plan  ?Neoplasm of uncertain behavior ?Right Alar Crease ? ?Epidermal / dermal shaving ? ?Lesion diameter (cm):  0.6 ?Informed consent: discussed and consent obtained   ?Patient was prepped and draped in usual sterile fashion: Area prepped with alcohol. ?Anesthesia: the lesion was anesthetized in a standard fashion   ?Anesthetic:  1% lidocaine w/ epinephrine 1-100,000 buffered w/ 8.4% NaHCO3 ?Instrument used: flexible razor blade   ?Hemostasis achieved with: pressure, aluminum chloride and electrodesiccation   ?Outcome: patient tolerated procedure well   ?Post-procedure details: wound care instructions given   ?Post-procedure details comment:  Ointment and small bandage applied.  ? ?Specimen 1 - Surgical pathology ?Differential Diagnosis: R/o ak vs bcc ? ?Check Margins: No ? ?R/o Ak vs bcc ? ?Bug bite without infection, initial  encounter ?Right Breast ? ?Benign, observe.   ? ?Start mometasone apply topically qd to bid prn for rash at right breast until clear.  RTC if not improving ? ?Topical steroids (such as triamcinolone, fluocinolone, fluocinonide, mometasone, clobetasol, halobetasol, betamethasone, hydrocortisone) can cause thinning and lightening of the skin if they are used for too long in the same area. Your physician has selected the right strength medicine for your problem and area affected on the body. Please use your medication only as directed by your physician to prevent side effects.  ? ? ?mometasone (ELOCON) 0.1 % cream - Right Breast ?Apply to rash at right breast qd to bid prn until clear ? ?Actinic keratosis ?lefl chest x 1 ? ?Hypertrophic ak at left chest ? ?Pt with good result post 5FU/Vit D cream course to chest and hands ? ?Actinic keratoses are precancerous spots that appear secondary to cumulative UV radiation exposure/sun exposure over time. They are chronic with expected duration over 1 year. A portion of actinic keratoses will progress to squamous cell carcinoma of the skin. It is not possible to reliably predict which spots will progress to skin cancer and so treatment is recommended to prevent development of skin cancer. ? ?Recommend daily broad spectrum sunscreen SPF 30+ to sun-exposed areas, reapply every 2 hours as needed.  ?Recommend staying in the shade or wearing long sleeves, sun glasses (UVA+UVB protection) and wide brim hats (4-inch brim around the entire circumference of the hat). ?Call for new or changing lesions. ? ?Destruction of  lesion - lefl chest x 1 ? ?Destruction method: cryotherapy   ?Informed consent: discussed and consent obtained   ?Lesion destroyed using liquid nitrogen: Yes   ?Region frozen until ice ball extended beyond lesion: Yes   ?Outcome: patient tolerated procedure well with no complications   ?Post-procedure details: wound care instructions given   ?Additional details:  Prior to  procedure, discussed risks of blister formation, small wound, skin dyspigmentation, or rare scar following cryotherapy. Recommend Vaseline ointment to treated areas while healing. ? ? ? ?Return if symptoms worsen or fail to improve, for keep follow up as schedule 07/2022. ?I, Ruthell Rummage, CMA, am acting as scribe for Brendolyn Patty, MD. ? ?Documentation: I have reviewed the above documentation for accuracy and completeness, and I agree with the above. ? ?Brendolyn Patty MD  ? ?

## 2021-09-02 ENCOUNTER — Telehealth: Payer: Self-pay

## 2021-09-02 DIAGNOSIS — C44311 Basal cell carcinoma of skin of nose: Secondary | ICD-10-CM

## 2021-09-02 NOTE — Progress Notes (Addendum)
Chief Complaint:   OBESITY Ann Jordan is here to discuss her progress with her obesity treatment plan along with follow-up of her obesity related diagnoses. Ann Jordan is on the Category 1 Plan and states she is following her eating plan approximately 99% of the time. Ann Jordan states she is walking for 60 minutes 5 times per week and riding a bike for 60 minutes 5 times per week.  Today's visit was #: 85 Starting weight: 205 lbs Starting date: 06/06/2020 Today's weight: 161 lbs Today's date: 08/27/2021 Total lbs lost to date: 44 lbs Total lbs lost since last in-office visit: 9 lbs  Interim History: Ann Jordan is down 9 lbs since her last visit and doing well overall. She is drinking more water. Her goal is 150 lbs.   Subjective:   1. Other hyperlipidemia Ann Jordan is not taking medications currently.   2. Gastroesophageal reflux disease, unspecified whether esophagitis present Ann Jordan is currently not taking medications.   Assessment/Plan:   1. Other hyperlipidemia Cardiovascular risk and specific lipid/LDL goals reviewed.  Ann Jordan will read labels. She will have no trans fats. She will keep MUFA's and PUFA's higher than saturated fats. We discussed several lifestyle modifications today and Ann Jordan will continue to work on diet, exercise and weight loss efforts. Orders and follow up as documented in patient record.   Counseling Intensive lifestyle modifications are the first line treatment for this issue. Dietary changes: Increase soluble fiber. Decrease simple carbohydrates. Exercise changes: Moderate to vigorous-intensity aerobic activity 150 minutes per week if tolerated. Lipid-lowering medications: see documented in medical record.  2. Gastroesophageal reflux disease, unspecified whether esophagitis present Intensive lifestyle modifications are the first line treatment for this issue. Ann Jordan will watch triggers. We discussed several lifestyle modifications today and she will continue to work on diet,  exercise and weight loss efforts. Orders and follow up as documented in patient record.   Counseling If a person has gastroesophageal reflux disease (GERD), food and stomach acid move back up into the esophagus and cause symptoms or problems such as damage to the esophagus. Anti-reflux measures include: raising the head of the bed, avoiding tight clothing or belts, avoiding eating late at night, not lying down shortly after mealtime, and achieving weight loss. Avoid ASA, NSAID's, caffeine, alcohol, and tobacco.  OTC Pepcid and/or Tums are often very helpful for as needed use.  However, for persisting chronic or daily symptoms, stronger medications like Omeprazole may be needed. You may need to avoid foods and drinks such as: Coffee and tea (with or without caffeine). Drinks that contain alcohol. Energy drinks and sports drinks. Bubbly (carbonated) drinks or sodas. Chocolate and cocoa. Peppermint and mint flavorings. Garlic and onions. Horseradish. Spicy and acidic foods. These include peppers, chili powder, curry powder, vinegar, hot sauces, and BBQ sauce. Citrus fruit juices and citrus fruits, such as oranges, lemons, and limes. Tomato-based foods. These include red sauce, chili, salsa, and pizza with red sauce. Fried and fatty foods. These include donuts, french fries, potato chips, and high-fat dressings. High-fat meats. These include hot dogs, rib eye steak, sausage, ham, and bacon.   3. Obesity, Current BMI 28.6 Ann Jordan is currently in the action stage of change. As such, her goal is to continue with weight loss efforts. She has agreed to the Category 1 Plan.   Ann Jordan will continue meal planning and she will continue intentional eating. She will increase protein.   Exercise goals:  As is.  Ann Jordan will do more exercise 2 hours daily.  Behavioral modification  strategies: increasing lean protein intake, decreasing simple carbohydrates, increasing vegetables, increasing water intake,  decreasing eating out, no skipping meals, meal planning and cooking strategies, keeping healthy foods in the home, and planning for success.  Ann Jordan has agreed to follow-up with our clinic in 4 weeks. She was informed of the importance of frequent follow-up visits to maximize her success with intensive lifestyle modifications for her multiple health conditions.   Objective:   Blood pressure 130/81, pulse 75, temperature 98 F (36.7 C), height '5\' 3"'$  (1.6 m), weight 161 lb (73 kg), SpO2 98 %. Body mass index is 28.52 kg/m.  General: Cooperative, alert, well developed, in no acute distress. HEENT: Conjunctivae and lids unremarkable. Cardiovascular: Regular rhythm.  Lungs: Normal work of breathing. Neurologic: No focal deficits.   Lab Results  Component Value Date   BUN 20 02/26/2021   NA 143 02/26/2021   K 4.6 02/26/2021   CL 106 02/26/2021   CO2 31 (A) 02/26/2021   Lab Results  Component Value Date   ALT 17 02/26/2021   AST 15 02/26/2021   ALKPHOS 82 02/26/2021   BILITOT 0.4 06/23/2012   Lab Results  Component Value Date   HGBA1C 5.2 03/11/2021   HGBA1C 5.3 06/06/2020   Lab Results  Component Value Date   INSULIN 6.6 03/11/2021   INSULIN 8.2 06/06/2020   Lab Results  Component Value Date   TSH 1.64 02/26/2021   Lab Results  Component Value Date   CHOL 232 (A) 02/26/2021   HDL 69 02/26/2021   LDLCALC 144 02/26/2021   TRIG 94 02/26/2021   Lab Results  Component Value Date   VD25OH 63.2 03/11/2021   VD25OH 34.2 06/06/2020   Lab Results  Component Value Date   WBC 5.9 02/26/2021   HGB 13.8 02/26/2021   HCT 42 02/26/2021   MCV 95 06/23/2012   PLT 233 02/26/2021   No results found for: IRON, TIBC, FERRITIN  Attestation Statements:   Reviewed by clinician on day of visit: allergies, medications, problem list, medical history, surgical history, family history, social history, and previous encounter notes.  I, Lizbeth Bark, RMA, am acting as Location manager  for [employer], DO.  I have reviewed the above documentation for accuracy and completeness, and I agree with the above. Jearld Lesch, DO

## 2021-09-02 NOTE — Telephone Encounter (Signed)
-----   Message from Brendolyn Patty, MD sent at 08/30/2021  2:24 PM EDT ----- ?Skin , right alar crease ?BASAL CELL CARCINOMA, NODULAR PATTERN, BASE INVOLVED ? ?BCC skin cancer.  Needs further treatment, options are EDC (office visit for scrape and burn, low 90s% cure rate, scar similar to biopsy scar),  Mohs surgery (referral for outpatient surgery to cut out, high 90s% cure rate, may have larger scar but generally heals up well), or radiation to biopsy site [facility], multiple treatment visits to clear without surgery with least scarring- low 90s% cure rate) ? ?If she wants to schedule f/up to discuss options in more detail, that would be fine.  If she decides on [facility] we could also treat it at that visit. ? ? - please call patient ?

## 2021-09-02 NOTE — Telephone Encounter (Signed)
Advised pt of bx results and treatment options EDC vs Mohs vs Radiation.  Patient will call back with which she prefers to do ?

## 2021-09-03 DIAGNOSIS — L292 Pruritus vulvae: Secondary | ICD-10-CM | POA: Diagnosis not present

## 2021-09-03 DIAGNOSIS — R300 Dysuria: Secondary | ICD-10-CM | POA: Diagnosis not present

## 2021-09-03 DIAGNOSIS — N9489 Other specified conditions associated with female genital organs and menstrual cycle: Secondary | ICD-10-CM | POA: Diagnosis not present

## 2021-09-03 NOTE — Telephone Encounter (Signed)
Patient returned call this morning. She would like referral for [facility] to Dr. Lacinda Axon. Referral placed and emailed. aw ?

## 2021-09-03 NOTE — Addendum Note (Signed)
Addended by: Johnsie Kindred R on: 09/03/2021 08:23 AM ? ? Modules accepted: Orders ? ?

## 2021-09-09 ENCOUNTER — Telehealth (INDEPENDENT_AMBULATORY_CARE_PROVIDER_SITE_OTHER): Payer: Self-pay | Admitting: Bariatrics

## 2021-09-09 NOTE — Telephone Encounter (Signed)
error 

## 2021-09-10 ENCOUNTER — Ambulatory Visit (INDEPENDENT_AMBULATORY_CARE_PROVIDER_SITE_OTHER): Payer: PPO | Admitting: Family Medicine

## 2021-09-10 ENCOUNTER — Encounter (INDEPENDENT_AMBULATORY_CARE_PROVIDER_SITE_OTHER): Payer: Self-pay | Admitting: Family Medicine

## 2021-09-10 VITALS — BP 112/68 | HR 65 | Temp 97.9°F | Ht 63.0 in | Wt 161.0 lb

## 2021-09-10 DIAGNOSIS — Z6828 Body mass index (BMI) 28.0-28.9, adult: Secondary | ICD-10-CM

## 2021-09-10 DIAGNOSIS — K219 Gastro-esophageal reflux disease without esophagitis: Secondary | ICD-10-CM | POA: Diagnosis not present

## 2021-09-10 DIAGNOSIS — M0580 Other rheumatoid arthritis with rheumatoid factor of unspecified site: Secondary | ICD-10-CM | POA: Diagnosis not present

## 2021-09-10 DIAGNOSIS — E7849 Other hyperlipidemia: Secondary | ICD-10-CM | POA: Diagnosis not present

## 2021-09-10 DIAGNOSIS — M3390 Dermatopolymyositis, unspecified, organ involvement unspecified: Secondary | ICD-10-CM | POA: Diagnosis not present

## 2021-09-10 DIAGNOSIS — E669 Obesity, unspecified: Secondary | ICD-10-CM

## 2021-09-17 DIAGNOSIS — Z853 Personal history of malignant neoplasm of breast: Secondary | ICD-10-CM | POA: Diagnosis not present

## 2021-09-17 DIAGNOSIS — F3342 Major depressive disorder, recurrent, in full remission: Secondary | ICD-10-CM | POA: Diagnosis not present

## 2021-09-17 DIAGNOSIS — E7849 Other hyperlipidemia: Secondary | ICD-10-CM | POA: Diagnosis not present

## 2021-09-17 DIAGNOSIS — M0580 Other rheumatoid arthritis with rheumatoid factor of unspecified site: Secondary | ICD-10-CM | POA: Diagnosis not present

## 2021-09-17 DIAGNOSIS — Z0000 Encounter for general adult medical examination without abnormal findings: Secondary | ICD-10-CM | POA: Diagnosis not present

## 2021-09-17 DIAGNOSIS — K219 Gastro-esophageal reflux disease without esophagitis: Secondary | ICD-10-CM | POA: Diagnosis not present

## 2021-09-17 DIAGNOSIS — M3390 Dermatopolymyositis, unspecified, organ involvement unspecified: Secondary | ICD-10-CM | POA: Diagnosis not present

## 2021-09-19 ENCOUNTER — Encounter (INDEPENDENT_AMBULATORY_CARE_PROVIDER_SITE_OTHER): Payer: Self-pay | Admitting: Bariatrics

## 2021-09-23 NOTE — Progress Notes (Unsigned)
Chief Complaint:   OBESITY Ann Jordan is here to discuss her progress with her obesity treatment plan along with follow-up of her obesity related diagnoses. Ann Jordan is on the Category 1 Plan and states she is following her eating plan approximately 90% of the time. Ann Jordan states she is walking and bike riding for 60-120 minutes 5 times per week.  Today's visit was #: 67 Starting weight: 205 lbs Starting date: 06/06/2020 Today's weight: 161 lbs Today's date: 09/10/2021 Total lbs lost to date: 29 Total lbs lost since last in-office visit: 0  Interim History: Ann Jordan has done well with maintaining her weight loss. She has eaten out a bit more, but she is trying to get back on track. She is starting to lose some muscle mass despite her exercise.   Subjective:   1. Other hyperlipidemia Ann Jordan had labs done today at [facility], and her LDL continues to increase. 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  Assessment/Plan:   1. Other hyperlipidemia Ann Jordan will continue her diet and exercise. She may be eligible to take the newer PCSK 9 inhibitors. She is to discuss with her PCP.   2. Obesity, Current BMI 28.6 Ann Jordan is currently in the action stage of change. As such, her goal is to continue with weight loss efforts. She has agreed to the Category 1 Plan.   Exercise goals: Add strengthening exercise, wobble cushion used and demonstrated today.   Behavioral modification strategies: increasing lean protein intake, decreasing eating out, and no skipping meals.  Ann Jordan has agreed to follow-up with our clinic in 4 weeks. She was informed of the importance of frequent follow-up visits to maximize her success with intensive lifestyle modifications for her multiple health conditions.   Objective:   Blood pressure 112/68, pulse 65, temperature 97.9 F (36.6 C), height '5\' 3"'$  (1.6 m), weight 161 lb (73 kg), SpO2 99 %. Body mass index is 28.52 kg/m.  General: Cooperative, alert, well developed,  in no acute distress. HEENT: Conjunctivae and lids unremarkable. Cardiovascular: Regular rhythm.  Lungs: Normal work of breathing. Neurologic: No focal deficits.   Lab Results  Component Value Date   BUN 20 02/26/2021   NA 143 02/26/2021   K 4.6 02/26/2021   CL 106 02/26/2021   CO2 31 (A) 02/26/2021   Lab Results  Component Value Date   ALT 17 02/26/2021   AST 15 02/26/2021   ALKPHOS 82 02/26/2021   BILITOT 0.4 06/23/2012   Lab Results  Component Value Date   HGBA1C 5.2 03/11/2021   HGBA1C 5.3 06/06/2020   Lab Results  Component Value Date   INSULIN 6.6 03/11/2021   INSULIN 8.2 06/06/2020   Lab Results  Component Value Date   TSH 1.64 02/26/2021   Lab Results  Component Value Date   CHOL 232 (A) 02/26/2021   HDL 69 02/26/2021   LDLCALC 144 02/26/2021   TRIG 94 02/26/2021   Lab Results  Component Value Date   VD25OH 63.2 03/11/2021   VD25OH 34.2 06/06/2020   Lab Results  Component Value Date   WBC 5.9 02/26/2021   HGB 13.8 02/26/2021   HCT 42 02/26/2021   MCV 95 06/23/2012   PLT 233 02/26/2021   No results found for: IRON, TIBC, FERRITIN  Attestation Statements:   Reviewed by clinician on day of visit: allergies, medications, problem list, medical history, surgical history, family history, social history, and previous encounter notes.  Time spent on visit including pre-visit chart review and post-visit  care and charting was 40 minutes.   I, Trixie Dredge, am acting as transcriptionist for Dennard Nip, MD.  I have reviewed the above documentation for accuracy and completeness, and I agree with the above. -  Dennard Nip, MD

## 2021-10-15 ENCOUNTER — Ambulatory Visit (INDEPENDENT_AMBULATORY_CARE_PROVIDER_SITE_OTHER): Payer: PPO | Admitting: Bariatrics

## 2021-11-07 DIAGNOSIS — C44311 Basal cell carcinoma of skin of nose: Secondary | ICD-10-CM | POA: Diagnosis not present

## 2021-11-14 ENCOUNTER — Telehealth: Payer: Self-pay

## 2021-11-14 DIAGNOSIS — R1032 Left lower quadrant pain: Secondary | ICD-10-CM | POA: Diagnosis not present

## 2021-11-14 DIAGNOSIS — K219 Gastro-esophageal reflux disease without esophagitis: Secondary | ICD-10-CM | POA: Diagnosis not present

## 2021-11-14 DIAGNOSIS — Z853 Personal history of malignant neoplasm of breast: Secondary | ICD-10-CM | POA: Diagnosis not present

## 2021-11-14 DIAGNOSIS — M3390 Dermatopolymyositis, unspecified, organ involvement unspecified: Secondary | ICD-10-CM | POA: Diagnosis not present

## 2021-11-14 DIAGNOSIS — M0580 Other rheumatoid arthritis with rheumatoid factor of unspecified site: Secondary | ICD-10-CM | POA: Diagnosis not present

## 2021-11-14 DIAGNOSIS — E7849 Other hyperlipidemia: Secondary | ICD-10-CM | POA: Diagnosis not present

## 2021-11-14 NOTE — Telephone Encounter (Signed)
Specimen tracking and history updated from [facility] progress notes and photos. aw

## 2021-11-18 ENCOUNTER — Ambulatory Visit (INDEPENDENT_AMBULATORY_CARE_PROVIDER_SITE_OTHER): Payer: PPO | Admitting: Bariatrics

## 2021-11-21 ENCOUNTER — Ambulatory Visit
Admission: RE | Admit: 2021-11-21 | Discharge: 2021-11-21 | Disposition: A | Discharge status: Home / Self Care | Payer: PPO | Source: Ambulatory Visit | Attending: Obstetrics and Gynecology | Admitting: Obstetrics and Gynecology

## 2021-11-21 DIAGNOSIS — Z1231 Encounter for screening mammogram for malignant neoplasm of breast: Secondary | ICD-10-CM | POA: Diagnosis not present

## 2021-11-26 ENCOUNTER — Other Ambulatory Visit: Payer: Self-pay | Admitting: Obstetrics and Gynecology

## 2021-11-26 DIAGNOSIS — R921 Mammographic calcification found on diagnostic imaging of breast: Secondary | ICD-10-CM

## 2021-11-26 DIAGNOSIS — R928 Other abnormal and inconclusive findings on diagnostic imaging of breast: Secondary | ICD-10-CM

## 2021-11-27 ENCOUNTER — Encounter (INDEPENDENT_AMBULATORY_CARE_PROVIDER_SITE_OTHER): Payer: Self-pay

## 2021-11-29 ENCOUNTER — Ambulatory Visit
Admission: RE | Admit: 2021-11-29 | Discharge: 2021-11-29 | Disposition: A | Discharge status: Home / Self Care | Payer: PPO | Source: Ambulatory Visit | Attending: Obstetrics and Gynecology | Admitting: Obstetrics and Gynecology

## 2021-11-29 DIAGNOSIS — R928 Other abnormal and inconclusive findings on diagnostic imaging of breast: Secondary | ICD-10-CM | POA: Diagnosis not present

## 2021-11-29 DIAGNOSIS — R921 Mammographic calcification found on diagnostic imaging of breast: Secondary | ICD-10-CM

## 2022-03-17 DIAGNOSIS — M3390 Dermatopolymyositis, unspecified, organ involvement unspecified: Secondary | ICD-10-CM | POA: Diagnosis not present

## 2022-03-17 DIAGNOSIS — M0580 Other rheumatoid arthritis with rheumatoid factor of unspecified site: Secondary | ICD-10-CM | POA: Diagnosis not present

## 2022-03-17 DIAGNOSIS — E7849 Other hyperlipidemia: Secondary | ICD-10-CM | POA: Diagnosis not present

## 2022-03-24 DIAGNOSIS — Z23 Encounter for immunization: Secondary | ICD-10-CM | POA: Diagnosis not present

## 2022-03-24 DIAGNOSIS — M3390 Dermatopolymyositis, unspecified, organ involvement unspecified: Secondary | ICD-10-CM | POA: Diagnosis not present

## 2022-03-24 DIAGNOSIS — E7849 Other hyperlipidemia: Secondary | ICD-10-CM | POA: Diagnosis not present

## 2022-03-24 DIAGNOSIS — Z1211 Encounter for screening for malignant neoplasm of colon: Secondary | ICD-10-CM | POA: Diagnosis not present

## 2022-03-24 DIAGNOSIS — Z853 Personal history of malignant neoplasm of breast: Secondary | ICD-10-CM | POA: Diagnosis not present

## 2022-03-24 DIAGNOSIS — K219 Gastro-esophageal reflux disease without esophagitis: Secondary | ICD-10-CM | POA: Diagnosis not present

## 2022-03-24 DIAGNOSIS — F3342 Major depressive disorder, recurrent, in full remission: Secondary | ICD-10-CM | POA: Diagnosis not present

## 2022-03-24 DIAGNOSIS — M0580 Other rheumatoid arthritis with rheumatoid factor of unspecified site: Secondary | ICD-10-CM | POA: Diagnosis not present

## 2022-03-24 DIAGNOSIS — G4733 Obstructive sleep apnea (adult) (pediatric): Secondary | ICD-10-CM | POA: Diagnosis not present

## 2022-03-25 DIAGNOSIS — C44311 Basal cell carcinoma of skin of nose: Secondary | ICD-10-CM | POA: Diagnosis not present

## 2022-04-09 DIAGNOSIS — G4733 Obstructive sleep apnea (adult) (pediatric): Secondary | ICD-10-CM | POA: Diagnosis not present

## 2022-04-10 DIAGNOSIS — Z03818 Encounter for observation for suspected exposure to other biological agents ruled out: Secondary | ICD-10-CM | POA: Diagnosis not present

## 2022-04-10 DIAGNOSIS — U071 COVID-19: Secondary | ICD-10-CM | POA: Diagnosis not present

## 2022-04-10 DIAGNOSIS — R112 Nausea with vomiting, unspecified: Secondary | ICD-10-CM | POA: Diagnosis not present

## 2022-05-05 DIAGNOSIS — H2512 Age-related nuclear cataract, left eye: Secondary | ICD-10-CM | POA: Diagnosis not present

## 2022-05-05 DIAGNOSIS — H2511 Age-related nuclear cataract, right eye: Secondary | ICD-10-CM | POA: Diagnosis not present

## 2022-06-11 DIAGNOSIS — H2000 Unspecified acute and subacute iridocyclitis: Secondary | ICD-10-CM | POA: Diagnosis not present

## 2022-06-11 DIAGNOSIS — M3501 Sicca syndrome with keratoconjunctivitis: Secondary | ICD-10-CM | POA: Diagnosis not present

## 2022-06-11 DIAGNOSIS — H2511 Age-related nuclear cataract, right eye: Secondary | ICD-10-CM | POA: Diagnosis not present

## 2022-06-12 DIAGNOSIS — M3390 Dermatopolymyositis, unspecified, organ involvement unspecified: Secondary | ICD-10-CM | POA: Diagnosis not present

## 2022-06-12 DIAGNOSIS — M0580 Other rheumatoid arthritis with rheumatoid factor of unspecified site: Secondary | ICD-10-CM | POA: Diagnosis not present

## 2022-06-17 ENCOUNTER — Other Ambulatory Visit: Payer: Self-pay | Admitting: Certified Nurse Midwife

## 2022-06-17 DIAGNOSIS — Z1231 Encounter for screening mammogram for malignant neoplasm of breast: Secondary | ICD-10-CM | POA: Diagnosis not present

## 2022-06-17 DIAGNOSIS — Z01419 Encounter for gynecological examination (general) (routine) without abnormal findings: Secondary | ICD-10-CM | POA: Diagnosis not present

## 2022-06-19 DIAGNOSIS — M25562 Pain in left knee: Secondary | ICD-10-CM | POA: Diagnosis not present

## 2022-06-20 DIAGNOSIS — H2000 Unspecified acute and subacute iridocyclitis: Secondary | ICD-10-CM | POA: Diagnosis not present

## 2022-06-20 DIAGNOSIS — H2511 Age-related nuclear cataract, right eye: Secondary | ICD-10-CM | POA: Diagnosis not present

## 2022-06-20 DIAGNOSIS — M3501 Sicca syndrome with keratoconjunctivitis: Secondary | ICD-10-CM | POA: Diagnosis not present

## 2022-07-17 DIAGNOSIS — H2000 Unspecified acute and subacute iridocyclitis: Secondary | ICD-10-CM | POA: Diagnosis not present

## 2022-07-22 ENCOUNTER — Ambulatory Visit: Payer: PPO | Admitting: Dermatology

## 2022-07-22 VITALS — BP 116/64 | HR 80

## 2022-07-22 DIAGNOSIS — L578 Other skin changes due to chronic exposure to nonionizing radiation: Secondary | ICD-10-CM | POA: Diagnosis not present

## 2022-07-22 DIAGNOSIS — Z1283 Encounter for screening for malignant neoplasm of skin: Secondary | ICD-10-CM

## 2022-07-22 DIAGNOSIS — Z7189 Other specified counseling: Secondary | ICD-10-CM

## 2022-07-22 DIAGNOSIS — D229 Melanocytic nevi, unspecified: Secondary | ICD-10-CM

## 2022-07-22 DIAGNOSIS — L570 Actinic keratosis: Secondary | ICD-10-CM | POA: Diagnosis not present

## 2022-07-22 DIAGNOSIS — Z85828 Personal history of other malignant neoplasm of skin: Secondary | ICD-10-CM | POA: Diagnosis not present

## 2022-07-22 DIAGNOSIS — B351 Tinea unguium: Secondary | ICD-10-CM | POA: Diagnosis not present

## 2022-07-22 DIAGNOSIS — Z79899 Other long term (current) drug therapy: Secondary | ICD-10-CM

## 2022-07-22 DIAGNOSIS — L821 Other seborrheic keratosis: Secondary | ICD-10-CM | POA: Diagnosis not present

## 2022-07-22 DIAGNOSIS — L814 Other melanin hyperpigmentation: Secondary | ICD-10-CM | POA: Diagnosis not present

## 2022-07-22 MED ORDER — TAVABOROLE 5 % EX SOLN: CUTANEOUS | 3 refills | Status: DC

## 2022-07-22 NOTE — Patient Instructions (Addendum)
5-fluorouracil/calcipotriene cream is is a type of field treatment used to treat precancers, thin skin cancers, and areas of sun damage. Expected reaction includes irritation and mild inflammation potentially progressing to more severe inflammation including redness, scaling, crusting and open sores/erosions.  If too much irritation occurs, ensure application of only a thin layer and decrease frequency of use to achieve a tolerable level of inflammation. Recommend applying Vaseline ointment to open sores as needed.  Minimize sun exposure while under treatment. Recommend daily broad spectrum sunscreen SPF 30+ to sun-exposed areas, reapply every 2 hours as needed.   If scaly areas still there after Red light treatment, may spot treat with 5-Fluorouracil Calcipotriene Cream twice a day x 5-7 days.   Due to recent changes in healthcare laws, you may see results of your pathology and/or laboratory studies on MyChart before the doctors have had a chance to review them. We understand that in some cases there may be results that are confusing or concerning to you. Please understand that not all results are received at the same time and often the doctors may need to interpret multiple results in order to provide you with the best plan of care or course of treatment. Therefore, we ask that you please give [place] 2 business days to thoroughly review all your results before contacting the office for clarification. Should we see a critical lab result, you will be contacted sooner.   If You Need Anything After Your Visit  If you have any questions or concerns for your doctor, please call our main line at [phone] and press option 4 to reach your doctor's medical assistant. If no one answers, please leave a voicemail as directed and we will return your call as soon as possible. Messages left after 4 pm will be answered the following business day.   You may also send [place] a message via [place]. We typically respond to  MyChart messages within 1-2 business days.  For prescription refills, please ask your pharmacy to contact our office. Our fax number is [phone].  If you have an urgent issue when the clinic is closed that cannot wait until the next business day, you can page your doctor at the number below.    Please note that while we do our best to be available for urgent issues outside of office hours, we are not available 24/7.   If you have an urgent issue and are unable to reach [place], you may choose to seek medical care at your doctor's office, retail clinic, urgent care center, or emergency room.  If you have a medical emergency, please immediately call 911 or go to the emergency department.  Pager Numbers  - Dr. Nehemiah Massed: [phone]  - Dr. Laurence Ferrari: [phone]  - Dr. Nicole Kindred: [phone]  In the event of inclement weather, please call our main line at [phone] for an update on the status of any delays or closures.  Dermatology Medication Tips: Please keep the boxes that topical medications come in in order to help keep track of the instructions about where and how to use these. Pharmacies typically print the medication instructions only on the boxes and not directly on the medication tubes.   If your medication is too expensive, please contact our office at [phone] option 4 or send [place] a message through [place].   We are unable to tell what your co-pay for medications will be in advance as this is different depending on your insurance coverage. However, we may be able to find a  substitute medication at lower cost or fill out paperwork to get insurance to cover a needed medication.   If a prior authorization is required to get your medication covered by your insurance company, please allow [place] 1-2 business days to complete this process.  Drug prices often vary depending on where the prescription is filled and some pharmacies may offer cheaper prices.  The website www.goodrx.com  contains coupons for medications through different pharmacies. The prices here do not account for what the cost may be with help from insurance (it may be cheaper with your insurance), but the website can give you the price if you did not use any insurance.  - You can print the associated coupon and take it with your prescription to the pharmacy.  - You may also stop by our office during regular business hours and pick up a GoodRx coupon card.  - If you need your prescription sent electronically to a different pharmacy, notify our office through [facility] or by phone at [phone] option 4.     Si Usted Necesita Algo Despus de Su Visita  Tambin puede enviarnos un mensaje a travs de Pharmacist, community. Por lo general respondemos a los mensajes de MyChart en el transcurso de 1 a 2 das hbiles.  Para renovar recetas, por favor pida a su farmacia que se ponga en contacto con nuestra oficina. Harland Dingwall de fax es [place] [phone].  Si tiene un asunto urgente cuando la clnica est cerrada y que no puede esperar hasta el siguiente da hbil, puede llamar/localizar a su doctor(a) al nmero que aparece a continuacin.   Por favor, tenga en cuenta que aunque hacemos todo lo posible para estar disponibles para asuntos urgentes fuera del horario de [place], no estamos disponibles las 24 horas del da, los 7 das de la [place].   Si tiene un problema urgente y no puede comunicarse con nosotros, puede optar por buscar atencin mdica  en el consultorio de su doctor(a), en una clnica privada, en un centro de atencin urgente o en una sala de emergencias.  Si tiene Engineering geologist, por favor llame inmediatamente al 911 o vaya a la sala de emergencias.  Nmeros de bper  - Dr. Nehemiah Massed: [phone]  - Dra. Moye: [phone]  - Dra. Nicole Kindred: [phone]  En caso de inclemencias del [place], por favor llame a Johnsie Kindred principal al [phone] para una actualizacin sobre el [place]  de cualquier retraso o cierre.  Consejos para la medicacin en dermatologa: Por favor, guarde las cajas en las que vienen los medicamentos de uso tpico para ayudarle a seguir las instrucciones sobre dnde y cmo usarlos. Las farmacias generalmente imprimen las instrucciones del medicamento slo en las cajas y no directamente en los tubos del [place].   Si su medicamento es muy caro, por favor, pngase en contacto con Zigmund Daniel llamando al [phone] y presione la opcin 4 o envenos un mensaje a travs de Pharmacist, community.   No podemos decirle cul ser su copago por los medicamentos por adelantado ya que esto es diferente dependiendo de la cobertura de su seguro. Sin embargo, es posible que podamos encontrar un medicamento sustituto a Electrical engineer un formulario para que el seguro cubra el medicamento que se considera necesario.   Si se requiere una autorizacin previa para que su compaa de seguros [place] su medicamento, por favor permtanos de 1 a 2 das hbiles para completar este proceso.  Los precios de los medicamentos varan con frecuencia dependiendo del Environmental consultant  de dnde se surte la receta y alguna farmacias pueden ofrecer precios ms baratos.  El sitio web www.goodrx.com tiene cupones para medicamentos de Airline pilot. Los precios aqu no tienen en cuenta lo que podra costar con la ayuda del seguro (puede ser ms barato con su seguro), pero el sitio web puede darle el precio si no utiliz Research scientist (physical sciences).  - Puede imprimir el cupn correspondiente y llevarlo con su receta a la farmacia.  - Tambin puede pasar por nuestra oficina durante el horario de atencin regular y Charity fundraiser una tarjeta de cupones de GoodRx.  - Si necesita que su receta se enve electrnicamente a una farmacia diferente, informe a nuestra oficina a travs de MyChart de [place] o por telfono llamando al [phone] y presione la opcin 4.

## 2022-07-22 NOTE — Progress Notes (Signed)
Follow-Up Visit   Subjective  Ann Jordan is a 67 y.o. female who presents for the following: Skin Cancer Screening and Full Body Skin Exam  The patient presents for Total-Body Skin Exam (TBSE) for skin cancer screening and mole check. The patient has spots, moles and lesions to be evaluated, some may be new or changing. She has a few scaly spots scattered she would like to point out to be checked. She also has a possible toenail fungus. She has tried OTC antifungal creams, but hasn't helped.  History of multiple BCCs. After last visit, she used 5FU/Calcipotriene to the chest and hands with a good reaction.     The following portions of the chart were reviewed this encounter and updated as appropriate: medications, allergies, medical history  Review of Systems:  No other skin or systemic complaints except as noted in HPI or Assessment and Plan.  Objective  Well appearing patient in no apparent distress; mood and affect are within normal limits.  A full examination was performed including scalp, head, eyes, ears, nose, lips, neck, chest, axillae, abdomen, back, buttocks, bilateral upper extremities, bilateral lower extremities, hands, feet, fingers, toes, fingernails, and toenails. All findings within normal limits unless otherwise noted below.   Relevant physical exam findings are noted in the Assessment and Plan.    Assessment & Plan   LENTIGINES, SEBORRHEIC KERATOSES (including R popliteal, intermammary), HEMANGIOMAS - Benign normal skin lesions - Benign-appearing - Call for any changes  MELANOCYTIC NEVI - Tan-brown and/or pink-flesh-colored symmetric macules and papules - Benign appearing on exam today - Observation - Call clinic for new or changing moles - Recommend daily use of broad spectrum spf 30+ sunscreen to sun-exposed areas.   ACTINIC DAMAGE WITH PRECANCEROUS ACTINIC KERATOSES Counseling for Topical Chemotherapy Management: Patient exhibits: - Severe, confluent  actinic changes with pre-cancerous actinic keratoses that is secondary to cumulative UV radiation exposure over time - Condition that is severe; chronic, not at goal. - diffuse scaly erythematous macules and papules with underlying dyspigmentation - Discussed Prescription "Field Treatment" topical Chemotherapy for Severe, Chronic Confluent Actinic Changes with Pre-Cancerous Actinic Keratoses Field treatment involves treatment of an entire area of skin that has confluent Actinic Changes (Sun/ Ultraviolet light damage) and PreCancerous Actinic Keratoses by method of PhotoDynamic Therapy (PDT) and/or prescription Topical Chemotherapy agents such as 5-fluorouracil, 5-fluorouracil/calcipotriene, and/or imiquimod.  The purpose is to decrease the number of clinically evident and subclinical PreCancerous lesions to prevent progression to development of skin cancer by chemically destroying early precancer changes that may or may not be visible.  It has been shown to reduce the risk of developing skin cancer in the treated area. As a result of treatment, redness, scaling, crusting, and open sores may occur during treatment course. One or more than one of these methods may be used and may have to be used several times to control, suppress and eliminate the PreCancerous changes. Discussed treatment course, expected reaction, and possible side effects. - Recommend daily broad spectrum sunscreen SPF 30+ to sun-exposed areas, reapply every 2 hours as needed.  - Staying in the shade or wearing long sleeves, sun glasses (UVA+UVB protection) and wide brim hats (4-inch brim around the entire circumference of the hat) are also recommended. - Call for new or changing lesions. - Plan Red Light Treatment with debridement to the face.    ACTINIC KERATOSIS Exam: Erythematous thin papules/macules with gritty scale at the R forehead, R nasal dorsum, L cheek, L lat eyebrow, dorsal hands  Actinic keratoses are precancerous spots  that appear secondary to cumulative UV radiation exposure/sun exposure over time. They are chronic with expected duration over 1 year. A portion of actinic keratoses will progress to squamous cell carcinoma of the skin. It is not possible to reliably predict which spots will progress to skin cancer and so treatment is recommended to prevent development of skin cancer.  Recommend daily broad spectrum sunscreen SPF 30+ to sun-exposed areas, reapply every 2 hours as needed.  Recommend staying in the shade or wearing long sleeves, sun glasses (UVA+UVB protection) and wide brim hats (4-inch brim around the entire circumference of the hat). Call for new or changing lesions.  Treatment Plan:  If residual scaly areas after PDT treatment, patient will spot treat areas with 5FU/Calcipotriene cream BID x 5-7 days.  5-fluorouracil/calcipotriene cream is is a type of field treatment used to treat precancers, thin skin cancers, and areas of sun damage. Expected reaction includes irritation and mild inflammation potentially progressing to more severe inflammation including redness, scaling, crusting and open sores/erosions.  If too much irritation occurs, ensure application of only a thin layer and decrease frequency of use to achieve a tolerable level of inflammation. Recommend applying Vaseline ointment to open sores as needed.  Minimize sun exposure while under treatment. Recommend daily broad spectrum sunscreen SPF 30+ to sun-exposed areas, reapply every 2 hours as needed.     SKIN CANCER SCREENING PERFORMED TODAY.  HISTORY OF BASAL CELL CARCINOMA OF THE SKIN - Multiple, see history - No evidence of recurrence today - Recommend regular full body skin exams - Recommend daily broad spectrum sunscreen SPF 30+ to sun-exposed areas, reapply every 2 hours as needed.  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 Condition is symptomatic/ bothersome to patient. Not currently at goal.   Exam: Distal thickening and mild yellow/white discoloration BL 2nd toenails   Treatment Plan: Start Kerydin Topical Solution Apply to toenails QHS dsp 36mL 3Rf.   Avoid wearing shoes that are too tight to prevent toenails rubbing/hitting shoe when walking.  Return for Red light with debridement, AK f/u with Dr Chauncey Cruel 3 mos  .  IJamesetta Orleans, CMA, am acting as scribe for Brendolyn Patty, MD .   Documentation: I have reviewed the above documentation for accuracy and completeness, and I agree with the above.  Brendolyn Patty, MD

## 2022-07-28 DIAGNOSIS — Z8739 Personal history of other diseases of the musculoskeletal system and connective tissue: Secondary | ICD-10-CM | POA: Diagnosis not present

## 2022-07-28 DIAGNOSIS — M25562 Pain in left knee: Secondary | ICD-10-CM | POA: Diagnosis not present

## 2022-07-28 DIAGNOSIS — G8929 Other chronic pain: Secondary | ICD-10-CM | POA: Diagnosis not present

## 2022-07-28 DIAGNOSIS — M0580 Other rheumatoid arthritis with rheumatoid factor of unspecified site: Secondary | ICD-10-CM | POA: Diagnosis not present

## 2022-07-28 DIAGNOSIS — Z8669 Personal history of other diseases of the nervous system and sense organs: Secondary | ICD-10-CM | POA: Diagnosis not present

## 2022-07-29 ENCOUNTER — Encounter: Payer: PPO | Admitting: Dermatology

## 2022-08-19 ENCOUNTER — Encounter: Payer: PPO | Admitting: Dermatology

## 2022-08-28 DIAGNOSIS — Z1211 Encounter for screening for malignant neoplasm of colon: Secondary | ICD-10-CM | POA: Diagnosis not present

## 2022-08-28 DIAGNOSIS — K219 Gastro-esophageal reflux disease without esophagitis: Secondary | ICD-10-CM | POA: Diagnosis not present

## 2022-08-28 DIAGNOSIS — Z6836 Body mass index (BMI) 36.0-36.9, adult: Secondary | ICD-10-CM | POA: Diagnosis not present

## 2022-09-01 ENCOUNTER — Telehealth: Payer: Self-pay

## 2022-09-01 ENCOUNTER — Ambulatory Visit (INDEPENDENT_AMBULATORY_CARE_PROVIDER_SITE_OTHER): Payer: PPO | Admitting: Dermatology

## 2022-09-01 DIAGNOSIS — L570 Actinic keratosis: Secondary | ICD-10-CM | POA: Diagnosis not present

## 2022-09-01 MED ORDER — AMINOLEVULINIC ACID HCL 10 % EX GEL
2000.0000 mg | Freq: Once | CUTANEOUS | Status: AC
  Administered 2022-09-01: 2000 mg via TOPICAL

## 2022-09-01 MED ORDER — FLUOROURACIL 5 % EX CREA: TOPICAL_CREAM | Freq: Two times a day (BID) | CUTANEOUS | 2 refills | Status: AC

## 2022-09-01 NOTE — Telephone Encounter (Signed)
Patient called for refill of 5FU/Calcipotriene cream, ok [facility]

## 2022-09-01 NOTE — Progress Notes (Signed)
Patient completed red light phototherapy with debridement today.  ACTINIC KERATOSES Exam: Erythematous thin papules/macules with gritty scale.  Treatment Plan:  Red Light Photodynamic therapy  Procedure discussed: discussed risks, benefits, side effects. and alternatives   Prep: site scrubbed/prepped with acetone   Debridement needed: Yes (performed by Physician with sand paper.  (CPT 96574)) Location:  face Number of lesions:  Multiple (> 15) Type of treatment:  Red light Aminolevulinic Acid (see MAR for details): Ameluz Aminolevulinic Acid comment:  [id number] Amount of Ameluz (mg):  1 Incubation time (minutes):  60 Number of minutes under lamp:  20 Cooling:  Fan Outcome: patient tolerated procedure well with no complications   Post-procedure details: sunscreen applied and aftercare instructions given to patient    Related Medications Aminolevulinic Acid HCl 10 % GEL 2,000 mg  Amanda R White  I personally debrided area prior to application of aminolevulinic acid.  Magdaline Zollars MD  Documentation: I have reviewed the above documentation for accuracy and completeness, and I agree with the above.  Keari Miu MD  [facility]-NURSE ROOM   

## 2022-09-01 NOTE — Patient Instructions (Signed)

## 2022-09-16 DIAGNOSIS — M3313 Other dermatomyositis without myopathy: Secondary | ICD-10-CM | POA: Diagnosis not present

## 2022-09-16 DIAGNOSIS — E7849 Other hyperlipidemia: Secondary | ICD-10-CM | POA: Diagnosis not present

## 2022-09-16 DIAGNOSIS — M0580 Other rheumatoid arthritis with rheumatoid factor of unspecified site: Secondary | ICD-10-CM | POA: Diagnosis not present

## 2022-09-23 DIAGNOSIS — K219 Gastro-esophageal reflux disease without esophagitis: Secondary | ICD-10-CM | POA: Diagnosis not present

## 2022-09-23 DIAGNOSIS — M3313 Other dermatomyositis without myopathy: Secondary | ICD-10-CM | POA: Diagnosis not present

## 2022-09-23 DIAGNOSIS — Z853 Personal history of malignant neoplasm of breast: Secondary | ICD-10-CM | POA: Diagnosis not present

## 2022-09-23 DIAGNOSIS — F3342 Major depressive disorder, recurrent, in full remission: Secondary | ICD-10-CM | POA: Diagnosis not present

## 2022-09-23 DIAGNOSIS — M0580 Other rheumatoid arthritis with rheumatoid factor of unspecified site: Secondary | ICD-10-CM | POA: Diagnosis not present

## 2022-09-23 DIAGNOSIS — E7849 Other hyperlipidemia: Secondary | ICD-10-CM | POA: Diagnosis not present

## 2022-09-23 DIAGNOSIS — Z0000 Encounter for general adult medical examination without abnormal findings: Secondary | ICD-10-CM | POA: Diagnosis not present

## 2022-10-21 ENCOUNTER — Ambulatory Visit: Payer: PPO | Admitting: Dermatology

## 2022-10-21 VITALS — BP 108/65 | HR 74

## 2022-10-21 DIAGNOSIS — W908XXA Exposure to other nonionizing radiation, initial encounter: Secondary | ICD-10-CM | POA: Diagnosis not present

## 2022-10-21 DIAGNOSIS — L578 Other skin changes due to chronic exposure to nonionizing radiation: Secondary | ICD-10-CM

## 2022-10-21 DIAGNOSIS — L570 Actinic keratosis: Secondary | ICD-10-CM | POA: Diagnosis not present

## 2022-10-21 DIAGNOSIS — Z7189 Other specified counseling: Secondary | ICD-10-CM

## 2022-10-21 DIAGNOSIS — Z85828 Personal history of other malignant neoplasm of skin: Secondary | ICD-10-CM

## 2022-10-21 DIAGNOSIS — Z5111 Encounter for antineoplastic chemotherapy: Secondary | ICD-10-CM | POA: Diagnosis not present

## 2022-10-21 NOTE — Patient Instructions (Addendum)
- Start 5-fluorouracil/calcipotriene cream twice a day for 7-10 days to affected areas including hands, 5-7 days spot treat areas on face.   Reviewed course of treatment and expected reaction.  Patient advised to expect inflammation and crusting and advised that erosions are possible.  Patient advised to be diligent with sun protection during and after treatment. Counseled to keep medication out of reach of children and pets.  5-Fluorouracil/Calcipotriene Patient Education   Actinic keratoses are the dry, red scaly spots on the skin caused by sun damage. A portion of these spots can turn into skin cancer with time, and treating them can help prevent development of skin cancer.   Treatment of these spots requires removal of the defective skin cells. There are various ways to remove actinic keratoses, including freezing with liquid nitrogen, treatment with creams, or treatment with a blue light procedure in the office.   5-fluorouracil cream is a topical cream used to treat actinic keratoses. It works by interfering with the growth of abnormal fast-growing skin cells, such as actinic keratoses. These cells peel off and are replaced by healthy ones.   5-fluorouracil/calcipotriene is a combination of the 5-fluorouracil cream with a vitamin D analog cream called calcipotriene. The calcipotriene alone does not treat actinic keratoses. However, when it is combined with 5-fluorouracil, it helps the 5-fluorouracil treat the actinic keratoses much faster so that the same results can be achieved with a much shorter treatment time.  INSTRUCTIONS FOR 5-FLUOROURACIL/CALCIPOTRIENE CREAM:   5-fluorouracil/calcipotriene cream typically only needs to be used for 7-10 days to the hands. A thin layer should be applied twice a day to the treatment areas recommended by your physician.   If your physician prescribed you separate tubes of 5-fluourouracil and calcipotriene, apply a thin layer of 5-fluorouracil followed by  a thin layer of calcipotriene.   Avoid contact with your eyes, nostrils, and mouth. Do not use 5-fluorouracil/calcipotriene cream on infected or open wounds.   You will develop redness, irritation and some crusting at areas where you have pre-cancer damage/actinic keratoses. IF YOU DEVELOP PAIN, BLEEDING, OR SIGNIFICANT CRUSTING, STOP THE TREATMENT EARLY - you have already gotten a good response and the actinic keratoses should clear up well.  Wash your hands after applying 5-fluorouracil 5% cream on your skin.   A moisturizer or sunscreen with a minimum SPF 30 should be applied each morning.   Once you have finished the treatment, you can apply a thin layer of Vaseline twice a day to irritated areas to soothe and calm the areas more quickly. If you experience significant discomfort, contact your physician.  For some patients it is necessary to repeat the treatment for best results.  SIDE EFFECTS: When using 5-fluorouracil/calcipotriene cream, you may have mild irritation, such as redness, dryness, swelling, or a mild burning sensation. This usually resolves within 2 weeks. The more actinic keratoses you have, the more redness and inflammation you can expect during treatment. Eye irritation has been reported rarely. If this occurs, please let us know.  If you have any trouble using this cream, please call the office. If you have any other questions about this information, please do not hesitate to ask me before you leave the office.   Cryotherapy Aftercare  Wash gently with soap and water everyday.   Apply Vaseline and Band-Aid daily until healed.    Due to recent changes in healthcare laws, you may see results of your pathology and/or laboratory studies on MyChart before the doctors have had a chance to  review them. We understand that in some cases there may be results that are confusing or concerning to you. Please understand that not all results are received at the same time and often the  doctors may need to interpret multiple results in order to provide you with the best plan of care or course of treatment. Therefore, we ask that you please give [place] 2 business days to thoroughly review all your results before contacting the office for clarification. Should we see a critical lab result, you will be contacted sooner.   If You Need Anything After Your Visit  If you have any questions or concerns for your doctor, please call our main line at [phone] and press option 4 to reach your doctor's medical assistant. If no one answers, please leave a voicemail as directed and we will return your call as soon as possible. Messages left after 4 pm will be answered the following business day.   You may also send [place] a message via MyChart. We typically respond to MyChart messages within 1-2 business days.  For prescription refills, please ask your pharmacy to contact our office. Our fax number is [phone].  If you have an urgent issue when the clinic is closed that cannot wait until the next business day, you can page your doctor at the number below.    Please note that while we do our best to be available for urgent issues outside of office hours, we are not available 24/7.   If you have an urgent issue and are unable to reach [place], you may choose to seek medical care at your doctor's office, retail clinic, urgent care center, or emergency room.  If you have a medical emergency, please immediately call 911 or go to the emergency department.  Pager Numbers  - Dr. Gwen Pounds: [phone]  - Dr. Neale Burly: [phone]  - Dr. Roseanne Reno: [phone]  In the event of inclement weather, please call our main line at [phone] for an update on the status of any delays or closures.  Dermatology Medication Tips: Please keep the boxes that topical medications come in in order to help keep track of the instructions about where and how to use these. Pharmacies typically print the medication  instructions only on the boxes and not directly on the medication tubes.   If your medication is too expensive, please contact our office at [phone] option 4 or send [place] a message through MyChart.   We are unable to tell what your co-pay for medications will be in advance as this is different depending on your insurance coverage. However, we may be able to find a substitute medication at lower cost or fill out paperwork to get insurance to cover a needed medication.   If a prior authorization is required to get your medication covered by your insurance company, please allow [place] 1-2 business days to complete this process.  Drug prices often vary depending on where the prescription is filled and some pharmacies may offer cheaper prices.  The website www.goodrx.com contains coupons for medications through different pharmacies. The prices here do not account for what the cost may be with help from insurance (it may be cheaper with your insurance), but the website can give you the price if you did not use any insurance.  - You can print the associated coupon and take it with your prescription to the pharmacy.  - You may also stop by our office during regular business hours and pick up a GoodRx coupon card.  -  If you need your prescription sent electronically to a different pharmacy, notify our office through [facility] or by phone at [phone] option 4.     Si Usted Necesita Algo Despus de Su Visita  Tambin puede enviarnos un mensaje a travs de Clinical cytogeneticist. Por lo general respondemos a los mensajes de MyChart en el transcurso de 1 a 2 das hbiles.  Para renovar recetas, por favor pida a su farmacia que se ponga en contacto con nuestra oficina. Annie Sable de fax es [place] [phone].  Si tiene un asunto urgente cuando la clnica est cerrada y que no puede esperar hasta el siguiente da hbil, puede llamar/localizar a su doctor(a) al nmero que aparece a continuacin.   Por  favor, tenga en cuenta que aunque hacemos todo lo posible para estar disponibles para asuntos urgentes fuera del horario de [place], no estamos disponibles las 24 horas del da, los 7 [address]  [address] de la [place].   Si tiene un problema urgente y no puede comunicarse con nosotros, puede optar por buscar atencin mdica  en el consultorio de su doctor(a), en una clnica privada, en un centro de atencin urgente o en una sala de emergencias.  Si tiene Engineer, drilling, por favor llame inmediatamente al 911 o vaya a la sala de emergencias.  Nmeros de bper  - Dr. Gwen Pounds: [phone]  - Dra. Moye: [phone]  - Dra. Roseanne Reno: [phone]  En caso de inclemencias del [place], por favor llame a Lacy Duverney principal al [phone] para una actualizacin sobre el [place] de cualquier retraso o cierre.  Consejos para la medicacin en dermatologa: Por favor, guarde las cajas en las que vienen los medicamentos de uso tpico para ayudarle a seguir las instrucciones sobre dnde y cmo usarlos. Las farmacias generalmente imprimen las instrucciones del medicamento slo en las cajas y no directamente en los tubos del [place].   Si su medicamento es muy caro, por favor, pngase en contacto con Rolm Gala llamando al [phone] y presione la opcin 4 o envenos un mensaje a travs de Clinical cytogeneticist.   No podemos decirle cul ser su copago por los medicamentos por adelantado ya que esto es diferente dependiendo de la cobertura de su seguro. Sin embargo, es posible que podamos encontrar un medicamento sustituto a Audiological scientist un formulario para que el seguro cubra el medicamento que se considera necesario.   Si se requiere una autorizacin previa para que su compaa de seguros [place] su medicamento, por favor permtanos de 1 a 2 das hbiles para completar [address]  Los precios de los medicamentos varan con frecuencia dependiendo del Environmental consultant de dnde se surte la receta y alguna farmacias  pueden ofrecer precios ms baratos.  El sitio web www.goodrx.com tiene cupones para medicamentos de Health and safety inspector. Los precios aqu no tienen en cuenta lo que podra costar con la ayuda del seguro (puede ser ms barato con su seguro), pero el sitio web puede darle el precio si no utiliz Tourist information centre manager.  - Puede imprimir el cupn correspondiente y llevarlo con su receta a la farmacia.  - Tambin puede pasar por nuestra oficina durante el horario de atencin regular y Education officer, museum una tarjeta de cupones de GoodRx.  - Si necesita que su receta se enve electrnicamente a una farmacia diferente, informe a nuestra oficina a travs de MyChart de [place] o por telfono llamando al [phone] y presione la opcin 4.

## 2022-10-21 NOTE — Progress Notes (Signed)
Follow-Up Visit   Subjective  Ann Jordan is a 67 y.o. female who presents for the following: 3 month follow-up AKs of the face. She had red light treatment with debridement on 09/01/2022 with good results. History of BCCs.   The patient has spots, moles and lesions to be evaluated, some may be new or changing.   The following portions of the chart were reviewed this encounter and updated as appropriate: medications, allergies, medical history  Review of Systems:  No other skin or systemic complaints except as noted in HPI or Assessment and Plan.  Objective  Well appearing patient in no apparent distress; mood and affect are within normal limits.  A focused examination was performed of the following areas: Face, arms, hands Relevant physical exam findings are noted in the Assessment and Plan.  L nasal dorsum x 1, R 3rd finger at PIP x 1, R 3rd webspace x 1 (3) Pink scaly macules.     Assessment & Plan   AK (actinic keratosis) (3) L nasal dorsum x 1, R 3rd finger at PIP x 1, R 3rd webspace x 1  Actinic keratoses are precancerous spots that appear secondary to cumulative UV radiation exposure/sun exposure over time. They are chronic with expected duration over 1 year. A portion of actinic keratoses will progress to squamous cell carcinoma of the skin. It is not possible to reliably predict which spots will progress to skin cancer and so treatment is recommended to prevent development of skin cancer.  Recommend daily broad spectrum sunscreen SPF 30+ to sun-exposed areas, reapply every 2 hours as needed.  Recommend staying in the shade or wearing long sleeves, sun glasses (UVA+UVB protection) and wide brim hats (4-inch brim around the entire circumference of the hat). Call for new or changing lesions.  Destruction of lesion - L nasal dorsum x 1, R 3rd finger at PIP x 1, R 3rd webspace x 1  Destruction method: cryotherapy   Informed consent: discussed and consent obtained    Lesion destroyed using liquid nitrogen: Yes   Region frozen until ice ball extended beyond lesion: Yes   Outcome: patient tolerated procedure well with no complications   Post-procedure details: wound care instructions given   Additional details:  Prior to procedure, discussed risks of blister formation, small wound, skin dyspigmentation, or rare scar following cryotherapy. Recommend Vaseline ointment to treated areas while healing.   ACTINIC DAMAGE WITH PRECANCEROUS ACTINIC KERATOSES Counseling for Topical Chemotherapy Management: Patient exhibits: - Severe, confluent actinic changes with pre-cancerous actinic keratoses that is secondary to cumulative UV radiation exposure over time - Condition that is severe; chronic, not at goal. - diffuse scaly erythematous macules and papules with underlying dyspigmentation - Discussed Prescription "Field Treatment" topical Chemotherapy for Severe, Chronic Confluent Actinic Changes with Pre-Cancerous Actinic Keratoses Field treatment involves treatment of an entire area of skin that has confluent Actinic Changes (Sun/ Ultraviolet light damage) and PreCancerous Actinic Keratoses by method of PhotoDynamic Therapy (PDT) and/or prescription Topical Chemotherapy agents such as 5-fluorouracil, 5-fluorouracil/calcipotriene, and/or imiquimod.  The purpose is to decrease the number of clinically evident and subclinical PreCancerous lesions to prevent progression to development of skin cancer by chemically destroying early precancer changes that may or may not be visible.  It has been shown to reduce the risk of developing skin cancer in the treated area. As a result of treatment, redness, scaling, crusting, and open sores may occur during treatment course. One or more than one of these methods may be used  and may have to be used several times to control, suppress and eliminate the PreCancerous changes. Discussed treatment course, expected reaction, and possible side  effects. - Recommend daily broad spectrum sunscreen SPF 30+ to sun-exposed areas, reapply every 2 hours as needed.  - Staying in the shade or wearing long sleeves, sun glasses (UVA+UVB protection) and wide brim hats (4-inch brim around the entire circumference of the hat) are also recommended. - Call for new or changing lesions. - Patient has 5FU/Calcipotriene cream at home. In the fall, start to hands and chest BID x 7-10 days, 5-7 spot treat areas on face. 5-fluorouracil/calcipotriene cream is is a type of field treatment used to treat precancers, thin skin cancers, and areas of sun damage. Expected reaction includes irritation and mild inflammation potentially progressing to more severe inflammation including redness, scaling, crusting and open sores/erosions.  If too much irritation occurs, ensure application of only a thin layer and decrease frequency of use to achieve a tolerable level of inflammation. Recommend applying Vaseline ointment to open sores as needed.  Minimize sun exposure while under treatment. Recommend daily broad spectrum sunscreen SPF 30+ to sun-exposed areas, reapply every 2 hours as needed.   HISTORY OF BASAL CELL CARCINOMA OF THE SKIN - No evidence of recurrence today R nasal alar crease - Recommend regular full body skin exams - Recommend daily broad spectrum sunscreen SPF 30+ to sun-exposed areas, reapply every 2 hours as needed.  - Call if any new or changing lesions are noted between office visits    Return in about 8 months (around 06/21/2023) for TBSE, Hx BCC, Hx AKs.  ICherlyn Labella, CMA, am acting as scribe for Willeen Niece, MD .   Documentation: I have reviewed the above documentation for accuracy and completeness, and I agree with the above.  Willeen Niece, MD

## 2022-10-22 DIAGNOSIS — G4733 Obstructive sleep apnea (adult) (pediatric): Secondary | ICD-10-CM | POA: Diagnosis not present

## 2022-10-30 DIAGNOSIS — M25562 Pain in left knee: Secondary | ICD-10-CM | POA: Diagnosis not present

## 2022-10-30 DIAGNOSIS — Z8739 Personal history of other diseases of the musculoskeletal system and connective tissue: Secondary | ICD-10-CM | POA: Diagnosis not present

## 2022-10-30 DIAGNOSIS — M0580 Other rheumatoid arthritis with rheumatoid factor of unspecified site: Secondary | ICD-10-CM | POA: Diagnosis not present

## 2022-10-30 DIAGNOSIS — G8929 Other chronic pain: Secondary | ICD-10-CM | POA: Diagnosis not present

## 2022-10-30 IMAGING — MG MM DIGITAL SCREENING BILAT W/ TOMO AND CAD
8 series · 8 of 24 positions shown · non-contrast
Comparison: Previous exam(s).

CLINICAL DATA: Screening.

EXAM:
DIGITAL SCREENING BILATERAL MAMMOGRAM WITH TOMOSYNTHESIS AND CAD
TECHNIQUE: Bilateral screening digital craniocaudal and mediolateral oblique
mammograms were obtained. Bilateral screening digital breast
tomosynthesis was performed. The images were evaluated with
computer-aided detection.

[R MLO synth-2D]
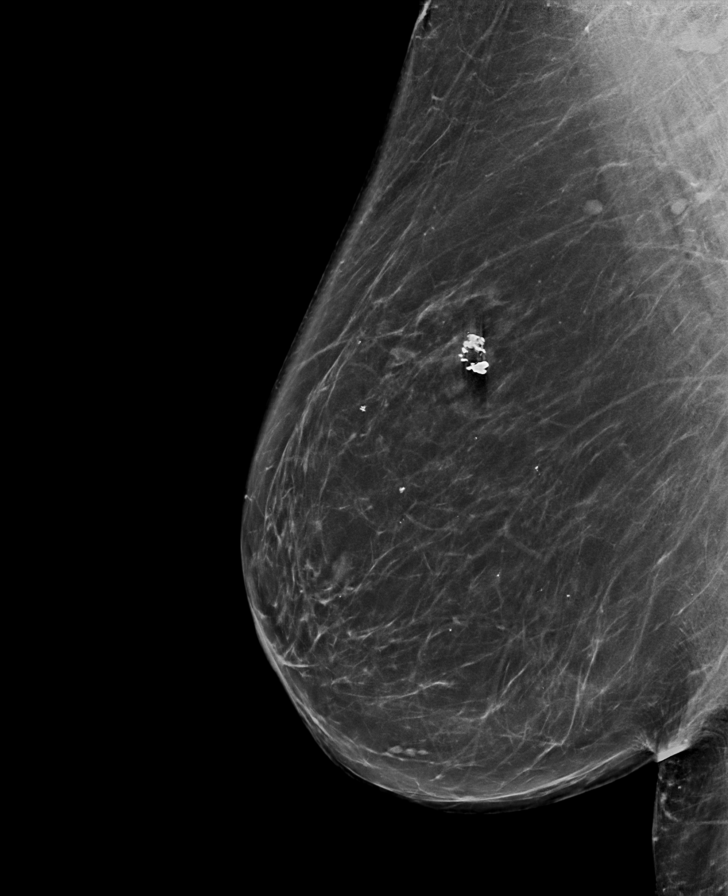

[L CC synth-2D]
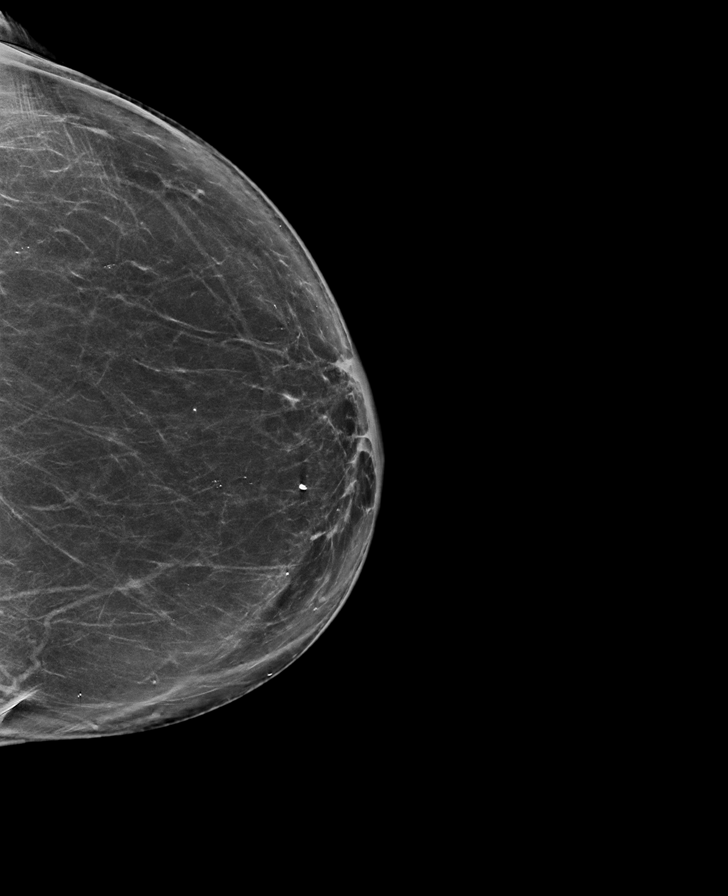

[R CC synth-2D]
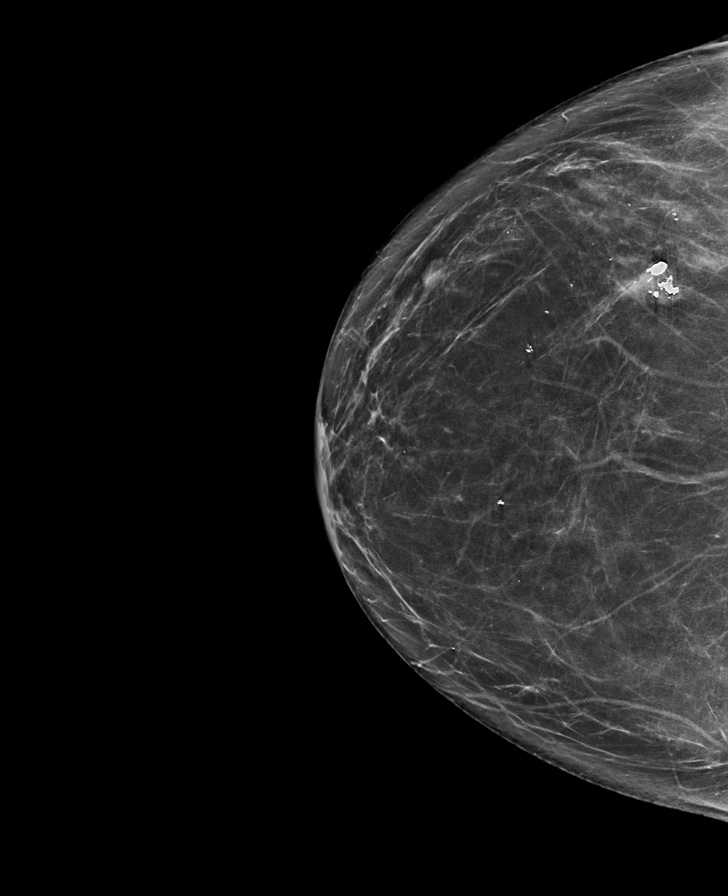

[L MLO synth-2D]
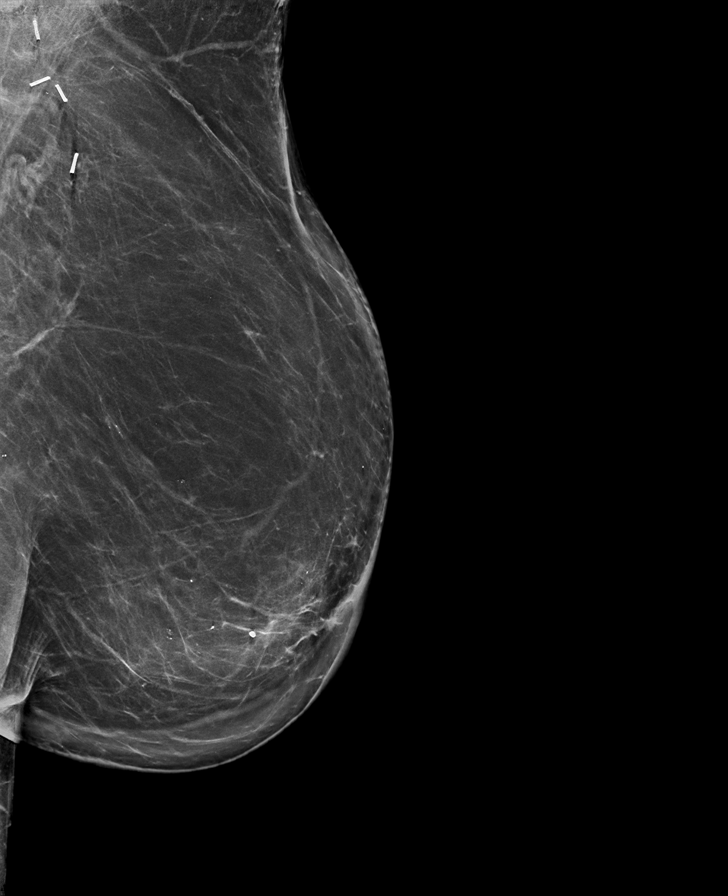

[R MLO tomo · tomo slice 47/93.0]
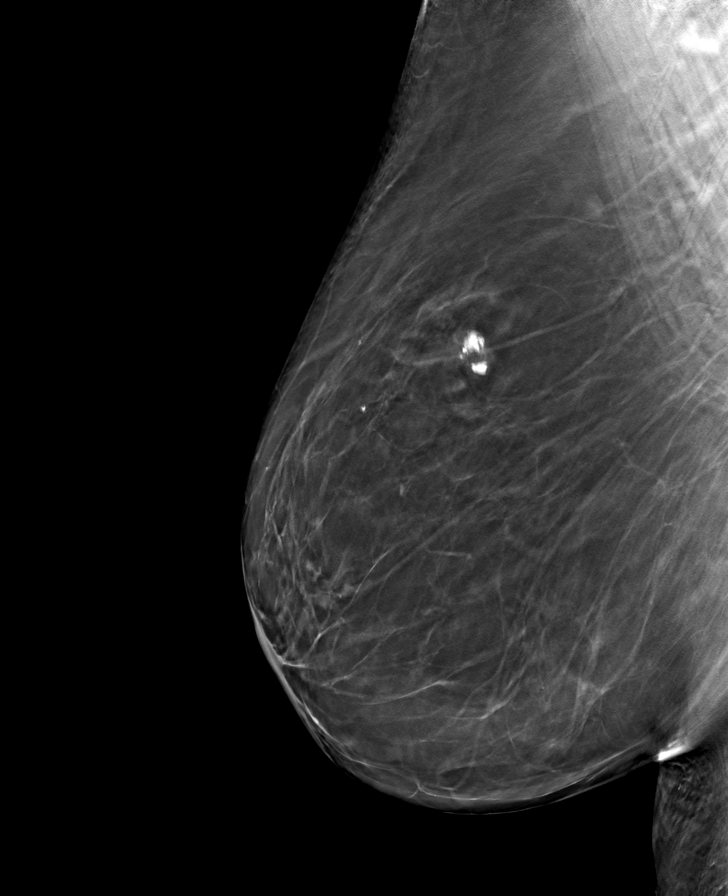

[R CC tomo · tomo slice 36/71.0]
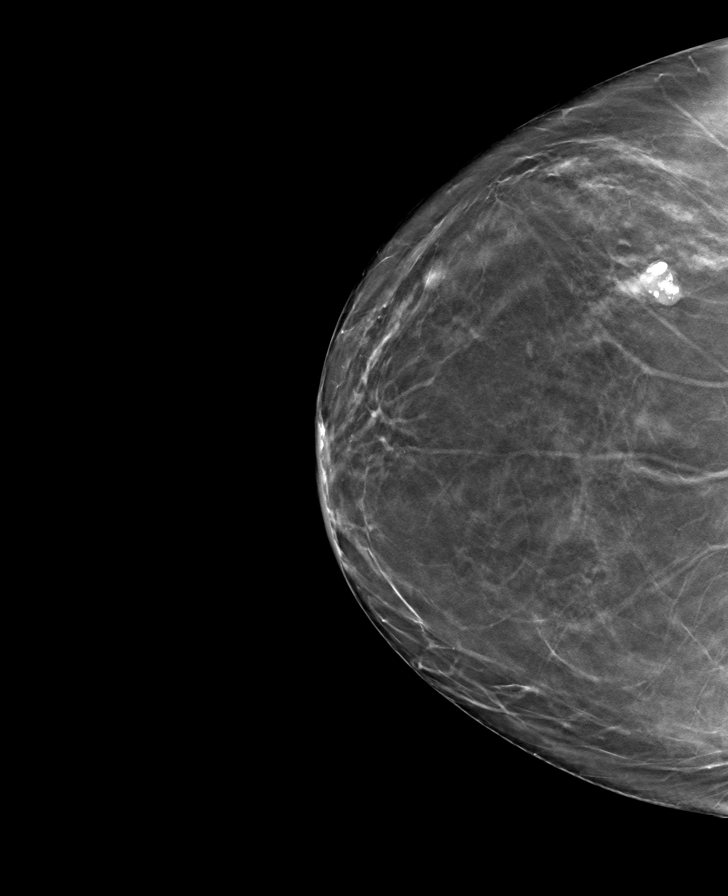

[L CC tomo · tomo slice 45/89.0]
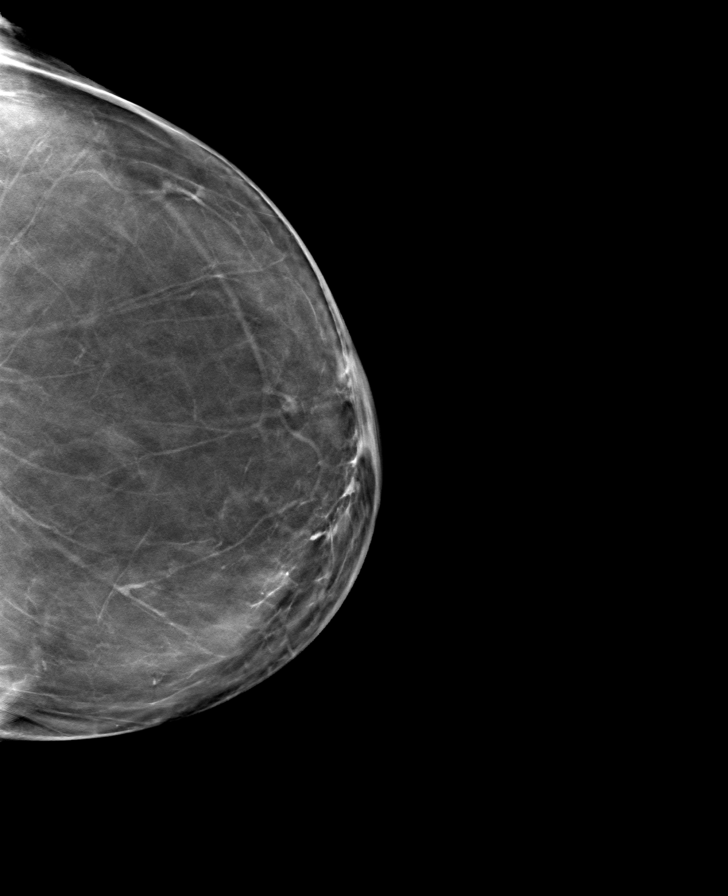

[L MLO tomo · tomo slice 51/100.0]
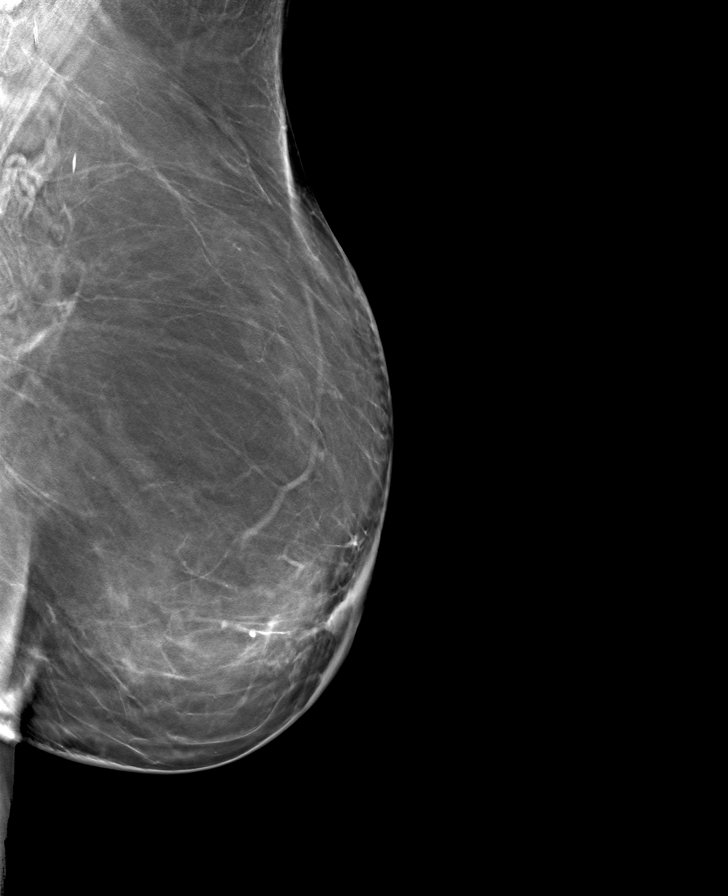

[8 of 24 positions shown; findings below may reference images not displayed]

ACR Breast Density Category b: There are scattered areas of
fibroglandular density.
FINDINGS: There are no findings suspicious for malignancy.
IMPRESSION: No mammographic evidence of malignancy. A result letter of this
screening mammogram will be mailed directly to the patient.

RECOMMENDATION:
Screening mammogram in one year. (Code:51-O-LD2)

BI-RADS CATEGORY  1: Negative.

## 2022-11-06 ENCOUNTER — Ambulatory Visit: Payer: PPO

## 2022-11-06 DIAGNOSIS — K5730 Diverticulosis of large intestine without perforation or abscess without bleeding: Secondary | ICD-10-CM | POA: Diagnosis not present

## 2022-11-06 DIAGNOSIS — K640 First degree hemorrhoids: Secondary | ICD-10-CM | POA: Diagnosis not present

## 2022-11-06 DIAGNOSIS — D125 Benign neoplasm of sigmoid colon: Secondary | ICD-10-CM | POA: Diagnosis not present

## 2022-11-06 DIAGNOSIS — K635 Polyp of colon: Secondary | ICD-10-CM | POA: Diagnosis not present

## 2022-11-06 DIAGNOSIS — Z1211 Encounter for screening for malignant neoplasm of colon: Secondary | ICD-10-CM | POA: Diagnosis not present

## 2022-11-24 ENCOUNTER — Ambulatory Visit
Admission: RE | Admit: 2022-11-24 | Discharge: 2022-11-24 | Disposition: A | Discharge status: Home / Self Care | Payer: PPO | Source: Ambulatory Visit | Attending: Certified Nurse Midwife | Admitting: Certified Nurse Midwife

## 2022-11-24 DIAGNOSIS — Z1231 Encounter for screening mammogram for malignant neoplasm of breast: Secondary | ICD-10-CM | POA: Diagnosis not present

## 2022-12-23 ENCOUNTER — Ambulatory Visit
Admission: RE | Admit: 2022-12-23 | Discharge: 2022-12-23 | Disposition: A | Discharge status: Home / Self Care | Payer: PPO | Source: Ambulatory Visit | Attending: Internal Medicine | Admitting: Internal Medicine

## 2022-12-23 ENCOUNTER — Other Ambulatory Visit: Payer: Self-pay | Admitting: Internal Medicine

## 2022-12-23 DIAGNOSIS — Z9049 Acquired absence of other specified parts of digestive tract: Secondary | ICD-10-CM | POA: Diagnosis not present

## 2022-12-23 DIAGNOSIS — M0580 Other rheumatoid arthritis with rheumatoid factor of unspecified site: Secondary | ICD-10-CM | POA: Diagnosis not present

## 2022-12-23 DIAGNOSIS — Z853 Personal history of malignant neoplasm of breast: Secondary | ICD-10-CM | POA: Diagnosis not present

## 2022-12-23 DIAGNOSIS — M3313 Other dermatomyositis without myopathy: Secondary | ICD-10-CM | POA: Diagnosis not present

## 2022-12-23 DIAGNOSIS — R1084 Generalized abdominal pain: Secondary | ICD-10-CM | POA: Diagnosis not present

## 2022-12-23 DIAGNOSIS — K5730 Diverticulosis of large intestine without perforation or abscess without bleeding: Secondary | ICD-10-CM | POA: Diagnosis not present

## 2022-12-23 DIAGNOSIS — F3342 Major depressive disorder, recurrent, in full remission: Secondary | ICD-10-CM | POA: Diagnosis not present

## 2022-12-23 DIAGNOSIS — N281 Cyst of kidney, acquired: Secondary | ICD-10-CM | POA: Diagnosis not present

## 2022-12-23 MED ORDER — IOHEXOL 300 MG/ML  SOLN
100.0000 mL | Freq: Once | INTRAMUSCULAR | Status: AC | PRN
  Administered 2022-12-23: 100 mL via INTRAVENOUS

## 2023-01-22 DIAGNOSIS — Z0289 Encounter for other administrative examinations: Secondary | ICD-10-CM

## 2023-02-18 ENCOUNTER — Ambulatory Visit (INDEPENDENT_AMBULATORY_CARE_PROVIDER_SITE_OTHER): Payer: PPO | Admitting: Internal Medicine

## 2023-02-24 ENCOUNTER — Encounter (INDEPENDENT_AMBULATORY_CARE_PROVIDER_SITE_OTHER): Payer: Self-pay | Admitting: *Deleted

## 2023-02-25 ENCOUNTER — Encounter (INDEPENDENT_AMBULATORY_CARE_PROVIDER_SITE_OTHER): Payer: Self-pay | Admitting: Internal Medicine

## 2023-02-25 ENCOUNTER — Ambulatory Visit (INDEPENDENT_AMBULATORY_CARE_PROVIDER_SITE_OTHER): Payer: PPO | Admitting: Internal Medicine

## 2023-02-25 VITALS — BP 129/84 | HR 67 | Temp 98.1°F | Ht 63.0 in | Wt 187.0 lb

## 2023-02-25 DIAGNOSIS — E7800 Pure hypercholesterolemia, unspecified: Secondary | ICD-10-CM

## 2023-02-25 DIAGNOSIS — M3313 Other dermatomyositis without myopathy: Secondary | ICD-10-CM

## 2023-02-25 DIAGNOSIS — E669 Obesity, unspecified: Secondary | ICD-10-CM | POA: Insufficient documentation

## 2023-02-25 DIAGNOSIS — R0602 Shortness of breath: Secondary | ICD-10-CM | POA: Diagnosis not present

## 2023-02-25 DIAGNOSIS — R5383 Other fatigue: Secondary | ICD-10-CM

## 2023-02-25 DIAGNOSIS — Z6833 Body mass index (BMI) 33.0-33.9, adult: Secondary | ICD-10-CM | POA: Insufficient documentation

## 2023-02-25 DIAGNOSIS — Z1331 Encounter for screening for depression: Secondary | ICD-10-CM | POA: Insufficient documentation

## 2023-02-25 NOTE — Progress Notes (Signed)
Office: [phone]  /  Fax: [phone]   Initial Visit  Ann Jordan (MR# [id number]) is an 67 y.o. female who presents for evaluation and treatment of obesity and related comorbidities. Current BMI is Body mass index is 33.13 kg/m. [name] has been struggling with her weight for many years and has been unsuccessful in either losing weight, maintaining weight loss, or reaching her healthy weight goal.  [name] is currently in the action stage of change and ready to dedicate time achieving and maintaining a healthier weight. [name] is interested in becoming our patient and working on intensive lifestyle modifications including (but not limited to) diet and exercise for weight loss.  When asked what else they would like to accomplish? She states: Improve energy levels and physical activity, Improve quality of life, and Lose a target amount of weight : 30  When asked how their weight has affected their life and health, she states: Has affected self-esteem, Contributed to medical problems, and Contributed to orthopedic problems or mobility issues  Weight history:  Discussed the use of AI scribe software for clinical note transcription with the patient, who gave verbal consent to proceed.  History of Present Illness   The patient, with a history of hypercholesterolemia and sleep apnea, presents for a consultation regarding weight management. She reports a long-standing struggle with weight since childhood, which was further complicated by weight gain during pregnancies. Despite multiple attempts at weight loss, including joining [employer] multiple times, the patient has experienced difficulty maintaining weight loss and has often encountered plateaus.  The patient has a history of breast cancer and was at her lowest weight around the time of diagnosis. She underwent five years of tamoxifen treatment. 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 She  was on prednisone for several years for this condition.  The patient has been proactive in managing her hypercholesterolemia, taking red yeast rice daily. She also takes over-the-counter Vitamin D as previously advised. She reports a history of knee pain due to arthritis, which has limited her ability to exercise. Despite this, she has been able to maintain a regular exercise routine, including walking and using a stationary bike four to five days a week for 60 minutes each session.  The patient has previously lost a significant amount of weight but has experienced a recent weight gain. She reports that during her weight loss period, she was consuming about 1000 calories a day, with a focus on protein intake. However, she found it challenging to maintain this diet due to persistent hunger.  The patient's goal is to lose about 30 pounds to improve her overall health and quality of life. She does not report any issues with fatigue or low energy levels and uses a CPAP machine at night for her sleep apnea. She expresses a desire to manage her weight in a sustainable way to avoid rapid weight loss and potential negative impacts on her health, such as bone strength and muscle mass loss.     She starting to note weight gain during : childhood, pregnancy, and menopause. Life events associated with weight gain include : pregnancy and illness.  Their highest weight has been 230 lbs. Previous weight-loss programs : Weight Watchers, Low Carb, and Tracking and Journaling. Their maximum weight loss was 80 lbs. Their greatest challenge with dieting : dieting fatigue. 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 Number of times they eat per day: 3 What beverages do they drink: water, caffeinated beverages, and alcohol, type: 2 glasses, once a month per week.  Food intolerances or restrictions: none. Food triggers: Stress, Boredom, Seeking reward, and comfort . Food cravings: Sugar and Starches  Current level of physical activity: Walking   Past medical history includes:   Past Medical History:  Diagnosis Date   Acid reflux    Anxiety    Arthritis    Back pain    Basal cell carcinoma 08/01/2009   Back. Superficial.   Basal cell carcinoma 02/01/2014   Right suprasternal notch. Nodular.   Basal cell carcinoma 04/27/2018   Left nasal ala. Superficial.   Basal cell carcinoma 04/27/2018   Left upper earlobe. Nodular.   Basal cell carcinoma 08/28/2021   Right Alar Crease,MOHs 11/07/2021   Breast cancer (HCC) 2004   left   Depression hyperlipi   Gallbladder problem    History of dermatomyositis    Hyperlipidemia    Intermittent palpitations    Joint pain    Personal history of radiation therapy    Sleep apnea    Vitamin D deficiency      Objective:   BP 129/84   Pulse 67   Temp 98.1 F (36.7 C)   Ht 5\' 3"  (1.6 m)   Wt 187 lb (84.8 kg)   SpO2 99%   BMI 33.13 kg/m  She was weighed on the bioimpedance scale: Body mass index is 33.13 kg/m.    EKG: Normal sinus rhythm, rate 60 there are no conduction abnormalities or signs of chamber enlargement.  Indirect Calorimeter completed today shows a VO2 of 206 and a REE of 1426.  Her calculated basal metabolic rate is 7253 thus her resting energy expenditure same as calculated.  DIAGNOSTIC DATA REVIEWED:  BMET    Component Value Date/Time   NA 143 02/26/2021 0000   K 4.6 02/26/2021 0000   CL 106 02/26/2021 0000   CO2 31 (A) 02/26/2021 0000   BUN 20 02/26/2021 0000   CALCIUM 9.5 02/26/2021 0000   Lab Results  Component Value Date   HGBA1C 5.2  03/11/2021   HGBA1C 5.3 06/06/2020   Lab Results  Component Value Date   INSULIN 6.6 03/11/2021   INSULIN 8.2 06/06/2020   CBC    Component Value Date/Time   WBC 5.9 02/26/2021 0000   WBC 5.7 06/23/2012 0949   RBC 4.23 02/26/2021 0000   HGB 13.8 02/26/2021 0000   HGB 13.5 06/23/2012 0949   HCT 42 02/26/2021 0000   HCT 40.7 06/23/2012 0949   PLT 233 02/26/2021 0000   PLT 231 06/23/2012 0949   MCV 95 06/23/2012 0949   MCH 31.6 06/23/2012 0949   MCHC 33.3 06/23/2012 0949   RDW 13.5 06/23/2012 0949   Iron/TIBC/Ferritin/ %Sat No results found for: "IRON", "TIBC", "FERRITIN", "IRONPCTSAT" Lipid Panel     Component Value Date/Time   CHOL 232 (A) 02/26/2021 0000   TRIG 94 02/26/2021 0000   HDL 69 02/26/2021 0000   LDLCALC 144 02/26/2021 0000   Hepatic Function Panel     Component Value Date/Time   PROT 7.2 06/23/2012 0949   ALBUMIN 4.2 02/26/2021 0000   ALBUMIN 3.6 06/23/2012 0949   AST 15 02/26/2021 0000   AST 23 06/23/2012  0949   ALT 17 02/26/2021 0000   ALT 36 06/23/2012 0949   ALKPHOS 82 02/26/2021 0000   ALKPHOS 86 06/23/2012 0949   BILITOT 0.4 06/23/2012 0949      Component Value Date/Time   TSH 1.64 02/26/2021 0000     Assessment and Plan:    TREATMENT PLAN FOR OBESITY:  Recommended Dietary Goals  Keymani is currently in the action stage of change. As such, her goal is to implement medically supervised weight loss plan.  She has agreed to start: keep a food journal with a target of  1200 calories and 30-40 grams of protein per meal and follow the Category 2 plan  Behavioral Intervention  We discussed the following Behavioral Modification Strategies today: increasing lean protein intake to established goals, decreasing simple carbohydrates , increasing vegetables, increasing lower glycemic fruits, increasing fiber rich foods, avoiding skipping meals, increasing water intake , work on meal planning and preparation, planning for success, better snacking  choices, and staying on track while traveling and vacationing.  Additional resources provided today:  Category 2 packet  Recommended Physical Activity Goals  Mersadies has been advised to work up to 150 minutes of moderate intensity aerobic activity a week and strengthening exercises 2-3 times per week for cardiovascular health, weight loss maintenance and preservation of muscle mass.   She has agreed to :  Continue current levels of physical activity we will discuss incorporation of strengthening and progression at the next office visit  Pharmacotherapy We will work on building a Therapist, art and behavioral strategies. We will discuss the role of pharmacotherapy as an adjunct at subsequent visits.   ASSOCIATED CONDITIONS ADDRESSED TODAY  Other fatigue -     EKG 12-Lead -     Vitamin B12  Generalized obesity Assessment & Plan: I reviewed biometrics as well as weight history trend in flowsheet.  When she started her journey in February 2022 she had lost 44 pounds over 12 months but was losing muscle mass at a 30% rate this is what likely resulted in the plateau.  She went through a phase of refeeding and weight gain which showed normalization of her basal metabolic rate and also increase in muscle mass but also body fat.    We discussed about focusing on gradual weight loss this time around she will shoot for 30 to 40 g of protein per meal, and 1200 kcal/day which is a 200-calorie deficit to prevent rapid weight loss and loss of muscle which I think she is susceptible to.  She is interested in antiobesity medications and we will review these at the next office visit.  We are checking metabolic parameters today and assess micronutrient deficiencies common in obesity, please refer to orders.  Orders: -     Hemoglobin A1c -     Insulin, random -     VITAMIN D 25 Hydroxy (Vit-D Deficiency, Fractures)  BMI 33.0-33.9,adult  Pure hypercholesterolemia Assessment & Plan: I  reviewed labs in Care Everywhere she had an LDL cholesterol around 144.  She takes red yeast rice as she has a history of dermatomyositis and therefore has been hesitant to take statin therapy.  We will assess cardiovascular risk at the next office visit and provide patient with further recommendations.  Orders: -     Comprehensive metabolic panel -     Lipid Panel With LDL/HDL Ratio  Dermatomyositis (HCC) Assessment & Plan: Patient reports condition is in remission.  I did review most recent labs which showed normal  CPKs and inflammatory markers.  She is also avoiding statins because of her condition.     PHYSICAL EXAM:  Blood pressure 129/84, pulse 67, temperature 98.1 F (36.7 C), height 5\' 3"  (1.6 m), weight 187 lb (84.8 kg), SpO2 99%. Body mass index is 33.13 kg/m.  General: She is overweight, cooperative, alert, well developed, and in no acute distress. PSYCH: Has normal mood, affect and thought process.   HEENT: EOMI, sclerae are anicteric. Lungs: Normal breathing effort, no conversational dyspnea. Extremities: No edema.  Neurologic: No gross sensory or motor deficits. No tremors or fasciculations noted.    DIAGNOSTIC DATA REVIEWED:  BMET    Component Value Date/Time   NA 143 02/26/2021 0000   K 4.6 02/26/2021 0000   CL 106 02/26/2021 0000   CO2 31 (A) 02/26/2021 0000   BUN 20 02/26/2021 0000   CALCIUM 9.5 02/26/2021 0000   Lab Results  Component Value Date   HGBA1C 5.2 03/11/2021   HGBA1C 5.3 06/06/2020   Lab Results  Component Value Date   INSULIN 6.6 03/11/2021   INSULIN 8.2 06/06/2020   Lab Results  Component Value Date   TSH 1.64 02/26/2021   CBC    Component Value Date/Time   WBC 5.9 02/26/2021 0000   WBC 5.7 06/23/2012 0949   RBC 4.23 02/26/2021 0000   HGB 13.8 02/26/2021 0000   HGB 13.5 06/23/2012 0949   HCT 42 02/26/2021 0000   HCT 40.7 06/23/2012 0949   PLT 233 02/26/2021 0000   PLT 231 06/23/2012 0949   MCV 95 06/23/2012 0949   MCH  31.6 06/23/2012 0949   MCHC 33.3 06/23/2012 0949   RDW 13.5 06/23/2012 0949   Iron Studies No results found for: "IRON", "TIBC", "FERRITIN", "IRONPCTSAT" Lipid Panel     Component Value Date/Time   CHOL 232 (A) 02/26/2021 0000   TRIG 94 02/26/2021 0000   HDL 69 02/26/2021 0000   LDLCALC 144 02/26/2021 0000   Hepatic Function Panel     Component Value Date/Time   PROT 7.2 06/23/2012 0949   ALBUMIN 4.2 02/26/2021 0000   ALBUMIN 3.6 06/23/2012 0949   AST 15 02/26/2021 0000   AST 23 06/23/2012 0949   ALT 17 02/26/2021 0000   ALT 36 06/23/2012 0949   ALKPHOS 82 02/26/2021 0000   ALKPHOS 86 06/23/2012 0949   BILITOT 0.4 06/23/2012 0949      Component Value Date/Time   TSH 1.64 02/26/2021 0000   Nutritional Lab Results  Component Value Date   VD25OH 63.2 03/11/2021   VD25OH 34.2 06/06/2020     She was informed of the importance of frequent follow-up visits to maximize her success with intensive lifestyle modifications for her multiple health conditions. She was informed we would discuss her lab results at her next visit unless there is a critical issue that needs to be addressed sooner. Romie agreed to keep her next visit at the agreed upon time to discuss these results.  ATTESTASTION STATEMENTS:  Reviewed by clinician on day of visit: allergies, medications, problem list, medical history, surgical history, family history, social history, and previous encounter notes.   I have spent 60 minutes in the care of the patient today including: preparing to see patient (e.g. review and interpretation of tests, old notes ), obtaining and/or reviewing separately obtained history, performing a medically appropriate examination or evaluation, counseling and educating the patient, ordering medications, test or procedures, documenting clinical information in the electronic or other health care record, and independently interpreting  results and communicating results to the patient, family,  or caregiver   Worthy Rancher, MD

## 2023-02-25 NOTE — Assessment & Plan Note (Addendum)
I reviewed biometrics as well as weight history trend in flowsheet.  When she started her journey in February 2022 she had lost 44 pounds over 12 months but was losing muscle mass at a 30% rate this is what likely resulted in the plateau.  She went through a phase of refeeding and weight gain which showed normalization of her basal metabolic rate and also increase in muscle mass but also body fat.    We discussed about focusing on gradual weight loss this time around she will shoot for 30 to 40 g of protein per meal, and 1200 kcal/day which is a 200-calorie deficit to prevent rapid weight loss and loss of muscle which I think she is susceptible to.  She is interested in antiobesity medications and we will review these at the next office visit.  We are checking metabolic parameters today and assess micronutrient deficiencies common in obesity, please refer to orders.

## 2023-02-25 NOTE — Assessment & Plan Note (Signed)
Patient reports condition is in remission.  I did review most recent labs which showed normal CPKs and inflammatory markers.  She is also avoiding statins because of her condition.

## 2023-02-25 NOTE — Progress Notes (Deleted)
At a Glance:  Vitals Temp: 98.1 F (36.7 C) BP: 129/84 Pulse Rate: 67 SpO2: 99 %   Anthropometric Measurements Height: 5\' 3"  (1.6 m) Weight: 187 lb (84.8 kg) BMI (Calculated): 33.13 Starting Weight: 187 lb Peak Weight: 230 lb Waist Measurement : 42 inches   Body Composition  Body Fat %: 43 % Fat Mass (lbs): 80.8 lbs Muscle Mass (lbs): 101.6 lbs Total Body Water (lbs): 71.6 lbs Visceral Fat Rating : 13   Other Clinical Data RMR: 1426 Fasting: Yes Labs: Yes Today's Visit #: 1 Starting Date: 02/25/23    EKG: Normal sinus rhythm, rate 60, no chamber enlargement or conduction abnormalities.  Indirect Calorimeter completed today shows a VO2 of 206 and a REE of 1426.  Her calculated basal metabolic rate is 2956 thus her basal metabolic rate is unchanged than expected.  Chief Complaint:  Obesity   Subjective:  Ann Jordan (MR# [id number]) is an 67 y.o. female who presents for evaluation and treatment of obesity and related comorbidities.   Ann Jordan is currently in the action stage of change and ready to dedicate time achieving and maintaining a healthier weight. Ann Jordan is interested in becoming our patient and working on intensive lifestyle modifications including (but not limited to) diet and exercise for weight loss.  Ann Jordan has been struggling with her weight. She has been unsuccessful in either losing weight, maintaining weight loss, or reaching her healthy weight goal.  Ann Jordan's habits were reviewed today and are as follows: Her family eats meals together, her desired weight loss is ***, she has been heavy most of her life, she started gaining weight ***, her heaviest weight ever was *** pounds, she is a picky eater and doesn't like to eat healthier foods, she has significant food cravings issues, she snacks frequently in the evenings, she wakes up frequently in the middle of the night to eat, she skips meals frequently, she frequently makes poor food choices, and she  struggles with emotional eating.  {MWM Started gaining weight (Optional):210964025} {Has dealt with weight issues.Marland Kitchen.(Optional):210964026}  Other Fatigue Ann Jordan {Actions; denies/reports/admits to:19208} daytime somnolence and {Actions; denies/reports/admits to:19208} waking up still tired. Patient has a history of symptoms of {OSA Sx:17850}. Ann Jordan generally gets {numbers (fuzzy):14653} hours of sleep per night, and states that she has {sleep quality:17851}. Snoring {is/are not:32546} present. Apneic episodes {is/are not:32546} present. Epworth Sleepiness Score is ***.   Shortness of Breath Ann Jordan notes increasing shortness of breath with exercising and seems to be worsening over time with weight gain. She notes getting out of breath sooner with activity than she used to. This {HAS HAS ION:62952} gotten worse recently. Ann Jordan denies shortness of breath at rest or orthopnea.   Depression Screen Ann Jordan's Food and Mood (modified PHQ-9) score was ***.     06/06/2020    8:53 AM  Depression screen PHQ 2/9  Decreased Interest 1  Down, Depressed, Hopeless 3  PHQ - 2 Score 4  Altered sleeping 2  Tired, decreased energy 3  Change in appetite 3  Feeling bad or failure about yourself  3  Trouble concentrating 0  Moving slowly or fidgety/restless 0  Suicidal thoughts 0  PHQ-9 Score 15  Difficult doing work/chores Not difficult at all     Assessment and Plan:   Other Fatigue Ann Jordan {DOES_DOES WUX:32440} feel that her weight is causing her energy to be lower than it should be. Fatigue may be related to obesity, depression or many other causes. Labs will be ordered, and in the meanwhile,  Ann Jordan will focus on self care including making healthy food choices, increasing physical activity and focusing on stress reduction.  Shortness of Breath Ann Jordan {DOES_DOES AVW:09811} feel that she gets out of breath more easily that she used to when she exercises. Ann Jordan's shortness of breath appears to be obesity  related and exercise induced. She has agreed to work on weight loss and gradually increase exercise to treat her exercise induced shortness of breath. Will continue to monitor closely.  {Depression Screen Positive:31580}   Other fatigue -     EKG 12-Lead  SOB (shortness of breath) on exertion  Depression screen  Generalized obesity  BMI 33.0-33.9,adult     Ann Jordan {CHL AMB IS/IS NOT:210130109} currently in the action stage of change and her goal is to {MWMwtloss#1:210800005}. I recommend Ann Jordan begin the structured treatment plan as follows:  She has agreed to {HWW Weight Loss BJYN:829562130}  Exercise goals: {MWM Exercise Recommendations:210964029}  Behavioral modification strategies:{HWW Behavior Modification:210964008}  She was informed of the importance of frequent follow-up visits to maximize her success with intensive lifestyle modifications for her multiple health conditions. She was informed we would discuss her lab results at her next visit unless there is a critical issue that needs to be addressed sooner. Ann Jordan agreed to keep her next visit at the agreed upon time to discuss these results.  Objective:  General: Cooperative, alert, well developed, in no acute distress. HEENT: Conjunctivae and lids unremarkable. Cardiovascular: Regular rhythm.  Lungs: Normal work of breathing. Neurologic: No focal deficits.   Lab Results  Component Value Date   BUN 20 02/26/2021   NA 143 02/26/2021   K 4.6 02/26/2021   CL 106 02/26/2021   CO2 31 (A) 02/26/2021   Lab Results  Component Value Date   ALT 17 02/26/2021   AST 15 02/26/2021   ALKPHOS 82 02/26/2021   BILITOT 0.4 06/23/2012   Lab Results  Component Value Date   HGBA1C 5.2 03/11/2021   HGBA1C 5.3 06/06/2020   Lab Results  Component Value Date   INSULIN 6.6 03/11/2021   INSULIN 8.2 06/06/2020   Lab Results  Component Value Date   TSH 1.64 02/26/2021   Lab Results  Component Value Date   CHOL 232 (A)  02/26/2021   HDL 69 02/26/2021   LDLCALC 144 02/26/2021   TRIG 94 02/26/2021   Lab Results  Component Value Date   WBC 5.9 02/26/2021   HGB 13.8 02/26/2021   HCT 42 02/26/2021   MCV 95 06/23/2012   PLT 233 02/26/2021   No results found for: "IRON", "TIBC", "FERRITIN"  Attestation Statements:  Reviewed by clinician on day of visit: allergies, medications, problem list, medical history, surgical history, family history, social history, and previous encounter notes.  Time spent on visit including pre-visit chart review and post-visit charting and care was *** minutes.   Worthy Rancher, MD

## 2023-02-25 NOTE — Assessment & Plan Note (Signed)
I reviewed labs in Care Everywhere she had an LDL cholesterol around 144.  She takes red yeast rice as she has a history of dermatomyositis and therefore has been hesitant to take statin therapy.  We will assess cardiovascular risk at the next office visit and provide patient with further recommendations.

## 2023-03-04 LAB — COMPREHENSIVE METABOLIC PANEL
ALT: 19 [IU]/L (ref 0–32)
AST: 16 [IU]/L (ref 0–40)
Albumin: 4.3 g/dL (ref 3.9–4.9)
Alkaline Phosphatase: 95 [IU]/L (ref 44–121)
BUN/Creatinine Ratio: 25 (ref 12–28)
BUN: 18 mg/dL (ref 8–27)
Bilirubin Total: 0.3 mg/dL (ref 0.0–1.2)
CO2: 24 mmol/L (ref 20–29)
Calcium: 9.2 mg/dL (ref 8.7–10.3)
Chloride: 101 mmol/L (ref 96–106)
Creatinine, Ser: 0.72 mg/dL (ref 0.57–1.00)
Globulin, Total: 2.4 g/dL (ref 1.5–4.5)
Glucose: 84 mg/dL (ref 70–99)
Potassium: 4.3 mmol/L (ref 3.5–5.2)
Sodium: 141 mmol/L (ref 134–144)
Total Protein: 6.7 g/dL (ref 6.0–8.5)
eGFR: 90 mL/min/{1.73_m2} (ref >59)

## 2023-03-04 LAB — INSULIN, RANDOM: INSULIN: 10.8 u[IU]/mL (ref 2.6–24.9)

## 2023-03-04 LAB — LIPID PANEL WITH LDL/HDL RATIO
Cholesterol, Total: 237 mg/dL — ABNORMAL HIGH (ref 100–199)
HDL: 73 mg/dL (ref >39)
LDL Chol Calc (NIH): 149 mg/dL — ABNORMAL HIGH (ref 0–99)
LDL/HDL Ratio: 2 ratio (ref 0.0–3.2)
Triglycerides: 85 mg/dL (ref 0–149)
VLDL Cholesterol Cal: 15 mg/dL (ref 5–40)

## 2023-03-04 LAB — VITAMIN D 25 HYDROXY (VIT D DEFICIENCY, FRACTURES): Vit D, 25-Hydroxy: 45.8 ng/mL (ref 30.0–100.0)

## 2023-03-04 LAB — HEMOGLOBIN A1C
Est. average glucose Bld gHb Est-mCnc: 108 mg/dL
Hgb A1c MFr Bld: 5.4 % (ref 4.8–5.6)

## 2023-03-04 LAB — VITAMIN B12: Vitamin B-12: 555 pg/mL (ref 232–1245)

## 2023-03-11 ENCOUNTER — Encounter (INDEPENDENT_AMBULATORY_CARE_PROVIDER_SITE_OTHER): Payer: Self-pay | Admitting: Internal Medicine

## 2023-03-11 ENCOUNTER — Ambulatory Visit (INDEPENDENT_AMBULATORY_CARE_PROVIDER_SITE_OTHER): Payer: PPO | Admitting: Internal Medicine

## 2023-03-11 VITALS — BP 132/81 | HR 72 | Temp 98.3°F | Ht 63.0 in | Wt 183.0 lb

## 2023-03-11 DIAGNOSIS — E785 Hyperlipidemia, unspecified: Secondary | ICD-10-CM | POA: Diagnosis not present

## 2023-03-11 DIAGNOSIS — M3313 Other dermatomyositis without myopathy: Secondary | ICD-10-CM

## 2023-03-11 DIAGNOSIS — E7800 Pure hypercholesterolemia, unspecified: Secondary | ICD-10-CM

## 2023-03-11 DIAGNOSIS — E669 Obesity, unspecified: Secondary | ICD-10-CM

## 2023-03-11 DIAGNOSIS — Z6832 Body mass index (BMI) 32.0-32.9, adult: Secondary | ICD-10-CM | POA: Diagnosis not present

## 2023-03-11 DIAGNOSIS — E66811 Obesity, class 1: Secondary | ICD-10-CM

## 2023-03-11 NOTE — Progress Notes (Signed)
Office: [phone]  /  Fax: [phone]  Weight Summary And Biometrics  Vitals Temp: 98.3 F (36.8 C) BP: 132/81 Pulse Rate: 72 SpO2: 99 %   Anthropometric Measurements Height: 5\' 3"  (1.6 m) Weight: 183 lb (83 kg) BMI (Calculated): 32.43 Weight at Last Visit: 187 lb Weight Lost Since Last Visit: 3 lb Weight Gained Since Last Visit: 0 lb Starting Weight: 187 lb Peak Weight: 230 lb   Body Composition  Body Fat %: 42.1 % Fat Mass (lbs): 77.6 lbs Muscle Mass (lbs): 101.2 lbs Total Body Water (lbs): 70.8 lbs Visceral Fat Rating : 12    RMR: 1426  Today's Visit #: 2  Starting Date: 03/07/23   Subjective   Chief Complaint: Obesity  Ann Jordan is here to discuss her progress with her obesity treatment plan. She is on the the Category 2 Plan and states she is following her eating plan approximately 80 % of the time. She states she is exercising 60 minutes 3 times per week.  Interval History:   Discussed the use of AI scribe software for clinical note transcription with the patient, who gave verbal consent to proceed.  Discussed the use of AI scribe software for clinical note transcription with the patient, who gave verbal consent to proceed.  History of Present Illness   The patient, known to have obesity, sleep apnea, and hyperlipidemia, presents for a follow-up consultation on weight management. She reports a recent weight loss of three pounds, which she attributes to increased focus on protein intake. However, she expresses concern about her cholesterol levels, which have increased despite her efforts to maintain a healthy diet and active lifestyle. She notes that her cholesterol levels have been fluctuating, and she is unsure how to manage this.  The patient has been trying to consume 30 grams of protein per meal, which she finds challenging but has resulted in feeling fuller and more satisfied. She has been incorporating protein into her diet through various  sources, including shakes, cottage cheese, yogurt, and lean meats. She notes that her carbohydrate and fat intake has increased slightly due to the increased protein intake.  Despite recent disruptions to her exercise routine due to family visits, the patient reports that she typically engages in an hour of aerobic exercise five days a week. She acknowledges that she has been less consistent with strength training exercises and expresses a commitment to incorporate more of these into her routine.  The patient has a history of breast cancer and a family history of Merkel cell cancer in a sibling. She reports being up-to-date with cancer screenings, including mammograms and colonoscopies. She also has a smoking history, but it does not meet the threshold for lung cancer screening.  The patient's primary concern during this consultation is her elevated cholesterol levels. She expresses frustration with the fluctuating levels and uncertainty about how to manage this. She is open to further testing, such as a coronary artery calcium score, to better understand her cardiovascular risk. She also expresses willingness to consider lipid-lowering therapies if necessary.        Orexigenic Control:  Denies problems with appetite and hunger signals.  Denies problems with satiety and satiation.  Denies problems with eating patterns and portion control.  Denies abnormal cravings. Denies feeling deprived or restricted.   Barriers identified: none.   Pharmacotherapy for weight loss: She is currently taking no anti-obesity medication.   Assessment and Plan   Treatment Plan For Obesity:  Recommended Dietary Goals  Ann Jordan is currently  in the action stage of change. As such, her goal is to continue weight management plan. She has agreed to: continue current plan  Behavioral Intervention  We discussed the following Behavioral Modification Strategies today: continue to work on maintaining a reduced calorie  state, getting the recommended amount of protein, incorporating whole foods, making healthy choices, staying well hydrated and practicing mindfulness when eating..  Additional resources provided today: None  Recommended Physical Activity Goals  Ann Jordan has been advised to work up to 150 minutes of moderate intensity aerobic activity a week and strengthening exercises 2-3 times per week for cardiovascular health, weight loss maintenance and preservation of muscle mass.   She has agreed to :  Think about enjoyable ways to increase daily physical activity and overcoming barriers to exercise and Increase physical activity in their day and reduce sedentary time (increase NEAT).  Pharmacotherapy  We discussed various medication options to help Ann Jordan with her weight loss efforts and we both agreed to : continue with nutritional and behavioral strategies  Associated Conditions Addressed Today  Assessment and Plan    Obesity   She has made progress in managing her obesity, having lost 3 pounds since the last visit. The importance of protein intake for muscle mass and metabolism was emphasized. Although her exercise routine was disrupted due to family visits, she is expected to resume. We will continue the high-protein diet, resume regular exercise aiming for 5 days a week, and follow up in 3-4 weeks.  Hyperlipidemia   Her total cholesterol is elevated at 237 mg/dL, up from 657 mg/dL, with elevated LDL and low triglycerides, placing her at a moderate cardiovascular risk (9% over 10 years). We discussed the potential genetic factors and dietary influences on her hyperlipidemia. 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 She will discuss the coronary artery calcium score with her primary care physician, consider Repatha if the score indicates plaque buildup, and continue monitoring  cholesterol levels.  General Health Maintenance   She is up to date with mammogram and colonoscopy, with no significant family history of cancer, except for a sister with Merkel cell cancer and her personal history of breast cancer. There is no need for lung cancer screening due to her limited smoking history. We emphasized the importance of regular cancer screenings, maintaining vitamin D supplementation, and encouraged regular physical activity, including non-exercise activity, strengthening exercises, and aerobic exercises.  Follow-up   We will follow up in 3-4 weeks and discuss the results of any additional tests or scans with her primary care physician.             Objective   Physical Exam:  Blood pressure 132/81, pulse 72, temperature 98.3 F (36.8 C), height 5\' 3"  (1.6 m), weight 183 lb (83 kg), SpO2 99%. Body mass index is 32.42 kg/m.  General: She is overweight, cooperative, alert, well developed, and in no acute distress. PSYCH: Has normal mood, affect and thought process.   HEENT: EOMI, sclerae are anicteric. Lungs: Normal breathing effort, no conversational dyspnea. Extremities: No edema.  Neurologic: No gross sensory or motor deficits. No tremors or fasciculations noted.    Diagnostic Data Reviewed:  BMET    Component Value Date/Time   NA 141 02/25/2023 1011   K 4.3 02/25/2023 1011   CL 101 02/25/2023 1011   CO2 24 02/25/2023 1011   GLUCOSE 84 02/25/2023 1011   BUN 18 02/25/2023 1011  CREATININE 0.72 02/25/2023 1011   CALCIUM 9.2 02/25/2023 1011   Lab Results  Component Value Date   HGBA1C 5.4 02/25/2023   HGBA1C 5.3 06/06/2020   Lab Results  Component Value Date   INSULIN 10.8 02/25/2023   INSULIN 8.2 06/06/2020   Lab Results  Component Value Date   TSH 1.64 02/26/2021   CBC    Component Value Date/Time   WBC 5.9 02/26/2021 0000   WBC 5.7 06/23/2012 0949   RBC 4.23 02/26/2021 0000   HGB 13.8 02/26/2021 0000   HGB 13.5 06/23/2012 0949    HCT 42 02/26/2021 0000   HCT 40.7 06/23/2012 0949   PLT 233 02/26/2021 0000   PLT 231 06/23/2012 0949   MCV 95 06/23/2012 0949   MCH 31.6 06/23/2012 0949   MCHC 33.3 06/23/2012 0949   RDW 13.5 06/23/2012 0949   Iron Studies No results found for: "IRON", "TIBC", "FERRITIN", "IRONPCTSAT" Lipid Panel     Component Value Date/Time   CHOL 237 (H) 02/25/2023 1011   TRIG 85 02/25/2023 1011   HDL 73 02/25/2023 1011   LDLCALC 149 (H) 02/25/2023 1011   Hepatic Function Panel     Component Value Date/Time   PROT 6.7 02/25/2023 1011   PROT 7.2 06/23/2012 0949   ALBUMIN 4.3 02/25/2023 1011   ALBUMIN 3.6 06/23/2012 0949   AST 16 02/25/2023 1011   AST 23 06/23/2012 0949   ALT 19 02/25/2023 1011   ALT 36 06/23/2012 0949   ALKPHOS 95 02/25/2023 1011   ALKPHOS 86 06/23/2012 0949   BILITOT 0.3 02/25/2023 1011   BILITOT 0.4 06/23/2012 0949      Component Value Date/Time   TSH 1.64 02/26/2021 0000   Nutritional Lab Results  Component Value Date   VD25OH 45.8 02/25/2023   VD25OH 63.2 03/11/2021   VD25OH 34.2 06/06/2020    Follow-Up   Return in about 3 weeks (around 04/01/2023) for For Weight Mangement with Dr. Rikki Spearing.Marland Kitchen She was informed of the importance of frequent follow up visits to maximize her success with intensive lifestyle modifications for her multiple health conditions.  Attestation Statement   Reviewed by clinician on day of visit: allergies, medications, problem list, medical history, surgical history, family history, social history, and previous encounter notes.     Worthy Rancher, MD

## 2023-03-18 DIAGNOSIS — M0580 Other rheumatoid arthritis with rheumatoid factor of unspecified site: Secondary | ICD-10-CM | POA: Diagnosis not present

## 2023-03-18 DIAGNOSIS — M3313 Other dermatomyositis without myopathy: Secondary | ICD-10-CM | POA: Diagnosis not present

## 2023-03-18 DIAGNOSIS — K219 Gastro-esophageal reflux disease without esophagitis: Secondary | ICD-10-CM | POA: Diagnosis not present

## 2023-03-18 DIAGNOSIS — E7849 Other hyperlipidemia: Secondary | ICD-10-CM | POA: Diagnosis not present

## 2023-03-25 DIAGNOSIS — M3313 Other dermatomyositis without myopathy: Secondary | ICD-10-CM | POA: Diagnosis not present

## 2023-03-25 DIAGNOSIS — Z131 Encounter for screening for diabetes mellitus: Secondary | ICD-10-CM | POA: Diagnosis not present

## 2023-03-25 DIAGNOSIS — Z853 Personal history of malignant neoplasm of breast: Secondary | ICD-10-CM | POA: Diagnosis not present

## 2023-03-25 DIAGNOSIS — K219 Gastro-esophageal reflux disease without esophagitis: Secondary | ICD-10-CM | POA: Diagnosis not present

## 2023-03-25 DIAGNOSIS — E7849 Other hyperlipidemia: Secondary | ICD-10-CM | POA: Diagnosis not present

## 2023-03-25 DIAGNOSIS — F3342 Major depressive disorder, recurrent, in full remission: Secondary | ICD-10-CM | POA: Diagnosis not present

## 2023-03-25 DIAGNOSIS — M0580 Other rheumatoid arthritis with rheumatoid factor of unspecified site: Secondary | ICD-10-CM | POA: Diagnosis not present

## 2023-03-27 ENCOUNTER — Other Ambulatory Visit: Payer: Self-pay | Admitting: Internal Medicine

## 2023-03-27 DIAGNOSIS — E7849 Other hyperlipidemia: Secondary | ICD-10-CM

## 2023-04-01 ENCOUNTER — Ambulatory Visit
Admission: RE | Admit: 2023-04-01 | Discharge: 2023-04-01 | Disposition: A | Discharge status: Home / Self Care | Payer: PPO | Source: Ambulatory Visit | Attending: Internal Medicine | Admitting: Internal Medicine

## 2023-04-01 DIAGNOSIS — E7849 Other hyperlipidemia: Secondary | ICD-10-CM | POA: Insufficient documentation

## 2023-04-08 ENCOUNTER — Ambulatory Visit (INDEPENDENT_AMBULATORY_CARE_PROVIDER_SITE_OTHER): Payer: PPO | Admitting: Internal Medicine

## 2023-04-08 ENCOUNTER — Encounter (INDEPENDENT_AMBULATORY_CARE_PROVIDER_SITE_OTHER): Payer: Self-pay | Admitting: Internal Medicine

## 2023-04-08 VITALS — BP 128/83 | HR 70 | Temp 97.9°F | Ht 63.0 in | Wt 182.0 lb

## 2023-04-08 DIAGNOSIS — E7800 Pure hypercholesterolemia, unspecified: Secondary | ICD-10-CM

## 2023-04-08 DIAGNOSIS — Z6832 Body mass index (BMI) 32.0-32.9, adult: Secondary | ICD-10-CM

## 2023-04-08 DIAGNOSIS — E66811 Obesity, class 1: Secondary | ICD-10-CM

## 2023-04-08 DIAGNOSIS — I2510 Atherosclerotic heart disease of native coronary artery without angina pectoris: Secondary | ICD-10-CM | POA: Diagnosis not present

## 2023-04-08 NOTE — Progress Notes (Unsigned)
Office: [phone]  /  Fax: [phone]  Weight Summary And Biometrics  Vitals Temp: 97.9 F (36.6 C) BP: 128/83 Pulse Rate: 70 SpO2: 100 %   Anthropometric Measurements Height: 5\' 3"  (1.6 m) Weight: 182 lb (82.6 kg) BMI (Calculated): 32.25 Weight at Last Visit: 183 lb Weight Lost Since Last Visit: 1 lb Weight Gained Since Last Visit: 0 lb Starting Weight: 187 lb Total Weight Loss (lbs): 5 lb (2.268 kg) Peak Weight: 230 lb   Body Composition  Body Fat %: 42.2 % Fat Mass (lbs): 77 lbs Muscle Mass (lbs): 100.2 lbs Total Body Water (lbs): 70.4 lbs Visceral Fat Rating : 12    RMR: 1426  Today's Visit #: 3  Starting Date: 03/07/23   Subjective   Chief Complaint: Obesity  Ann Jordan is here to discuss her progress with her obesity treatment plan. She is on the the Category 2 Plan and states she is following her eating plan approximately 50 % of the time. She states she is exercising 45-60 minutes 5 times per week.  Interval History:   Since last office visit she has {emweight change:30888}. She reports {EMADHERENCE:28838::"good adherence to reduced calorie nutritional plan."} She has been working on [employer] labels","not skipping meals","increasing protein intake at every meal","drinking more water","making healthier choices","reducing portion sizes","incorporating more whole foods"}   Orexigenic Control:  {ACTIONS;DENIES/REPORTS:21021675::"Denies"} problems with appetite and hunger signals.  {ACTIONS;DENIES/REPORTS:21021675::"Denies"} problems with satiety and satiation.  {ACTIONS;DENIES/REPORTS:21021675::"Denies"} problems with eating patterns and portion control.  {ACTIONS;DENIES/REPORTS:21021675::"Denies"} abnormal cravings. {ACTIONS;DENIES/REPORTS:21021675::"Denies"} feeling deprived or restricted.   Barriers identified: {EMOBESITYBARRIERS:28841}.   Pharmacotherapy for weight loss: She is currently taking {EMPharmaco:28845}.    Assessment and Plan   Treatment Plan For Obesity:  Recommended Dietary Goals  Ann Jordan is currently in the action stage of change. As such, her goal is to continue weight management plan. She has agreed to: {EMWTLOSSPLAN:29297::"continue current plan"}  Behavioral Intervention  We discussed the following Behavioral Modification Strategies today: {EMWMwtlossstrategies:28914::"continue to work on maintaining a reduced calorie state, getting the recommended amount of protein, incorporating whole foods, making healthy choices, staying well hydrated and practicing mindfulness when eating."}.  Additional resources provided today: {EMadditionalresources:29169::"None"}  Recommended Physical Activity Goals  Ann Jordan has been advised to work up to 150 minutes of moderate intensity aerobic activity a week and strengthening exercises 2-3 times per week for cardiovascular health, weight loss maintenance and preservation of muscle mass.   She has agreed to :  {EMEXERCISE:28847::"Think about enjoyable ways to increase daily physical activity and overcoming barriers to exercise","Increase physical activity in their day and reduce sedentary time (increase NEAT)."}  Pharmacotherapy  We discussed various medication options to help Ann Jordan with her weight loss efforts and we both agreed to : {EMagreedrx:29170}  Associated Conditions Addressed Today  There are no diagnoses linked to this encounter.   Objective   Physical Exam:  Blood pressure 128/83, pulse 70, temperature 97.9 F (36.6 C), height 5\' 3"  (1.6 m), weight 182 lb (82.6 kg), SpO2 100%. Body mass index is 32.24 kg/m.  General: She is overweight, cooperative, alert, well developed, and in no acute distress. PSYCH: Has normal mood, affect and thought process.   HEENT: EOMI, sclerae are anicteric. Lungs: Normal breathing effort, no conversational dyspnea. Extremities: No edema.  Neurologic: No gross sensory or motor deficits. No tremors or  fasciculations noted.    Diagnostic Data Reviewed:  BMET    Component Value Date/Time   NA 141 02/25/2023 1011   K 4.3 02/25/2023 1011   CL 101 02/25/2023  1011   CO2 24 02/25/2023 1011   GLUCOSE 84 02/25/2023 1011   BUN 18 02/25/2023 1011   CREATININE 0.72 02/25/2023 1011   CALCIUM 9.2 02/25/2023 1011   Lab Results  Component Value Date   HGBA1C 5.4 02/25/2023   HGBA1C 5.3 06/06/2020   Lab Results  Component Value Date   INSULIN 10.8 02/25/2023   INSULIN 8.2 06/06/2020   Lab Results  Component Value Date   TSH 1.64 02/26/2021   CBC    Component Value Date/Time   WBC 5.9 02/26/2021 0000   WBC 5.7 06/23/2012 0949   RBC 4.23 02/26/2021 0000   HGB 13.8 02/26/2021 0000   HGB 13.5 06/23/2012 0949   HCT 42 02/26/2021 0000   HCT 40.7 06/23/2012 0949   PLT 233 02/26/2021 0000   PLT 231 06/23/2012 0949   MCV 95 06/23/2012 0949   MCH 31.6 06/23/2012 0949   MCHC 33.3 06/23/2012 0949   RDW 13.5 06/23/2012 0949   Iron Studies No results found for: "IRON", "TIBC", "FERRITIN", "IRONPCTSAT" Lipid Panel     Component Value Date/Time   CHOL 237 (H) 02/25/2023 1011   TRIG 85 02/25/2023 1011   HDL 73 02/25/2023 1011   LDLCALC 149 (H) 02/25/2023 1011   Hepatic Function Panel     Component Value Date/Time   PROT 6.7 02/25/2023 1011   PROT 7.2 06/23/2012 0949   ALBUMIN 4.3 02/25/2023 1011   ALBUMIN 3.6 06/23/2012 0949   AST 16 02/25/2023 1011   AST 23 06/23/2012 0949   ALT 19 02/25/2023 1011   ALT 36 06/23/2012 0949   ALKPHOS 95 02/25/2023 1011   ALKPHOS 86 06/23/2012 0949   BILITOT 0.3 02/25/2023 1011   BILITOT 0.4 06/23/2012 0949      Component Value Date/Time   TSH 1.64 02/26/2021 0000   Nutritional Lab Results  Component Value Date   VD25OH 45.8 02/25/2023   VD25OH 63.2 03/11/2021   VD25OH 34.2 06/06/2020    Follow-Up   No follow-ups on file.Marland Kitchen She was informed of the importance of frequent follow up visits to maximize her success with intensive  lifestyle modifications for her multiple health conditions.  Attestation Statement   Reviewed by clinician on day of visit: allergies, medications, problem list, medical history, surgical history, family history, social history, and previous encounter notes.     Worthy Rancher, MD   Office: [phone]  /  Fax: [phone]  Weight Summary And Biometrics  Vitals Temp: 97.9 F (36.6 C) BP: 128/83 Pulse Rate: 70 SpO2: 100 %   Anthropometric Measurements Height: 5\' 3"  (1.6 m) Weight: 182 lb (82.6 kg) BMI (Calculated): 32.25 Weight at Last Visit: 183 lb Weight Lost Since Last Visit: 1 lb Weight Gained Since Last Visit: 0 lb Starting Weight: 187 lb Total Weight Loss (lbs): 5 lb (2.268 kg) Peak Weight: 230 lb   Body Composition  Body Fat %: 42.2 % Fat Mass (lbs): 77 lbs Muscle Mass (lbs): 100.2 lbs Total Body Water (lbs): 70.4 lbs Visceral Fat Rating : 12    RMR: 1426  Today's Visit #: 3  Starting Date: 03/07/23   Subjective   Chief Complaint: Obesity  Jaasia is here to discuss her progress with her obesity treatment plan. She is on the {MWMwtlossportion/plan2:23431} and states she is following her eating plan approximately *** % of the time. She states she is exercising *** minutes *** times per week.  Interval History:   Since last office visit she has {emweight change:30888}. She reports {EMADHERENCE:28838::"good  adherence to reduced calorie nutritional plan."} She has been working on [employer] labels","not skipping meals","increasing protein intake at every meal","drinking more water","making healthier choices","reducing portion sizes","incorporating more whole foods"}   Orexigenic Control:  {ACTIONS;DENIES/REPORTS:21021675::"Denies"} problems with appetite and hunger signals.  {ACTIONS;DENIES/REPORTS:21021675::"Denies"} problems with satiety and satiation.  {ACTIONS;DENIES/REPORTS:21021675::"Denies"} problems with eating  patterns and portion control.  {ACTIONS;DENIES/REPORTS:21021675::"Denies"} abnormal cravings. {ACTIONS;DENIES/REPORTS:21021675::"Denies"} feeling deprived or restricted.   Barriers identified: {EMOBESITYBARRIERS:28841}.   Pharmacotherapy for weight loss: She is currently taking {EMPharmaco:28845}.   Assessment and Plan   Treatment Plan For Obesity:  Recommended Dietary Goals  Ann Jordan is currently in the action stage of change. As such, her goal is to continue weight management plan. She has agreed to: {EMWTLOSSPLAN:29297::"continue current plan"}  Behavioral Intervention  We discussed the following Behavioral Modification Strategies today: {EMWMwtlossstrategies:28914::"continue to work on maintaining a reduced calorie state, getting the recommended amount of protein, incorporating whole foods, making healthy choices, staying well hydrated and practicing mindfulness when eating."}.  Additional resources provided today: {EMadditionalresources:29169::"None"}  Recommended Physical Activity Goals  Ann Jordan has been advised to work up to 150 minutes of moderate intensity aerobic activity a week and strengthening exercises 2-3 times per week for cardiovascular health, weight loss maintenance and preservation of muscle mass.   She has agreed to :  {EMEXERCISE:28847::"Think about enjoyable ways to increase daily physical activity and overcoming barriers to exercise","Increase physical activity in their day and reduce sedentary time (increase NEAT)."}  Pharmacotherapy  We discussed various medication options to help Ann Jordan with her weight loss efforts and we both agreed to : {EMagreedrx:29170}  Associated Conditions Addressed Today  There are no diagnoses linked to this encounter.   Objective   Physical Exam:  Blood pressure 128/83, pulse 70, temperature 97.9 F (36.6 C), height 5\' 3"  (1.6 m), weight 182 lb (82.6 kg), SpO2 100%. Body mass index is 32.24 kg/m.  General: She is  overweight, cooperative, alert, well developed, and in no acute distress. PSYCH: Has normal mood, affect and thought process.   HEENT: EOMI, sclerae are anicteric. Lungs: Normal breathing effort, no conversational dyspnea. Extremities: No edema.  Neurologic: No gross sensory or motor deficits. No tremors or fasciculations noted.    Diagnostic Data Reviewed:  BMET    Component Value Date/Time   NA 141 02/25/2023 1011   K 4.3 02/25/2023 1011   CL 101 02/25/2023 1011   CO2 24 02/25/2023 1011   GLUCOSE 84 02/25/2023 1011   BUN 18 02/25/2023 1011   CREATININE 0.72 02/25/2023 1011   CALCIUM 9.2 02/25/2023 1011   Lab Results  Component Value Date   HGBA1C 5.4 02/25/2023   HGBA1C 5.3 06/06/2020   Lab Results  Component Value Date   INSULIN 10.8 02/25/2023   INSULIN 8.2 06/06/2020   Lab Results  Component Value Date   TSH 1.64 02/26/2021   CBC    Component Value Date/Time   WBC 5.9 02/26/2021 0000   WBC 5.7 06/23/2012 0949   RBC 4.23 02/26/2021 0000   HGB 13.8 02/26/2021 0000   HGB 13.5 06/23/2012 0949   HCT 42 02/26/2021 0000   HCT 40.7 06/23/2012 0949   PLT 233 02/26/2021 0000   PLT 231 06/23/2012 0949   MCV 95 06/23/2012 0949   MCH 31.6 06/23/2012 0949   MCHC 33.3 06/23/2012 0949   RDW 13.5 06/23/2012 0949   Iron Studies No results found for: "IRON", "TIBC", "FERRITIN", "IRONPCTSAT" Lipid Panel     Component Value Date/Time   CHOL 237 (H) 02/25/2023 1011  TRIG 85 02/25/2023 1011   HDL 73 02/25/2023 1011   LDLCALC 149 (H) 02/25/2023 1011   Hepatic Function Panel     Component Value Date/Time   PROT 6.7 02/25/2023 1011   PROT 7.2 06/23/2012 0949   ALBUMIN 4.3 02/25/2023 1011   ALBUMIN 3.6 06/23/2012 0949   AST 16 02/25/2023 1011   AST 23 06/23/2012 0949   ALT 19 02/25/2023 1011   ALT 36 06/23/2012 0949   ALKPHOS 95 02/25/2023 1011   ALKPHOS 86 06/23/2012 0949   BILITOT 0.3 02/25/2023 1011   BILITOT 0.4 06/23/2012 0949      Component Value  Date/Time   TSH 1.64 02/26/2021 0000   Nutritional Lab Results  Component Value Date   VD25OH 45.8 02/25/2023   VD25OH 63.2 03/11/2021   VD25OH 34.2 06/06/2020    Follow-Up   No follow-ups on file.Marland Kitchen She was informed of the importance of frequent follow up visits to maximize her success with intensive lifestyle modifications for her multiple health conditions.  Attestation Statement   Reviewed by clinician on day of visit: allergies, medications, problem list, medical history, surgical history, family history, social history, and previous encounter notes.     Worthy Rancher, MD

## 2023-04-09 DIAGNOSIS — I2510 Atherosclerotic heart disease of native coronary artery without angina pectoris: Secondary | ICD-10-CM | POA: Insufficient documentation

## 2023-04-09 NOTE — Assessment & Plan Note (Signed)
I reviewed labs in Care Everywhere she had an LDL cholesterol around 144.  Her cardiovascular risk is about 10% because of her history of dermatomyositis she was hesitant about statin therapy but had a coronary artery calcium score that was elevated.  She therefore has been prescribed rosuvastatin by PCP.  She reports tolerating medication well but is concerned about the risk of myositis.  We discussed that Repatha has been reported to be safe for use in dermatomyositis so if she experiences any symptoms of myopathy this is potential therapy.

## 2023-04-09 NOTE — Assessment & Plan Note (Signed)
Reviewed coronary artery calcium score which was 36 and at the 65th percentile.  She is aware of her cardiovascular risk and is committed to looking at ways to reduce this.  She has started statin therapy and will be monitoring for symptoms of myositis.

## 2023-04-09 NOTE — Assessment & Plan Note (Signed)
 See obesity treatment plan

## 2023-04-23 DIAGNOSIS — H2512 Age-related nuclear cataract, left eye: Secondary | ICD-10-CM | POA: Diagnosis not present

## 2023-04-23 DIAGNOSIS — H2513 Age-related nuclear cataract, bilateral: Secondary | ICD-10-CM | POA: Diagnosis not present

## 2023-04-23 DIAGNOSIS — H524 Presbyopia: Secondary | ICD-10-CM | POA: Diagnosis not present

## 2023-04-23 DIAGNOSIS — M3501 Sicca syndrome with keratoconjunctivitis: Secondary | ICD-10-CM | POA: Diagnosis not present

## 2023-05-14 ENCOUNTER — Ambulatory Visit (INDEPENDENT_AMBULATORY_CARE_PROVIDER_SITE_OTHER): Payer: PPO | Admitting: Internal Medicine

## 2023-05-14 ENCOUNTER — Encounter (INDEPENDENT_AMBULATORY_CARE_PROVIDER_SITE_OTHER): Payer: Self-pay | Admitting: Internal Medicine

## 2023-05-14 VITALS — BP 121/77 | HR 72 | Temp 97.7°F | Ht 63.0 in | Wt 183.0 lb

## 2023-05-14 DIAGNOSIS — I2510 Atherosclerotic heart disease of native coronary artery without angina pectoris: Secondary | ICD-10-CM

## 2023-05-14 DIAGNOSIS — E66811 Obesity, class 1: Secondary | ICD-10-CM | POA: Diagnosis not present

## 2023-05-14 DIAGNOSIS — G4733 Obstructive sleep apnea (adult) (pediatric): Secondary | ICD-10-CM | POA: Diagnosis not present

## 2023-05-14 DIAGNOSIS — Z6832 Body mass index (BMI) 32.0-32.9, adult: Secondary | ICD-10-CM

## 2023-05-14 MED ORDER — ZEPBOUND 2.5 MG/0.5ML ~~LOC~~ SOAJ: 2.5000 mg | SUBCUTANEOUS | 0 refills | Status: DC

## 2023-05-14 NOTE — Assessment & Plan Note (Signed)
Patient reports having an AHI of 36 consistent with severe OSA.  She is on PAP therapy and despite good adherence she still has some dynamic time fatigue, somnolence, and nighttime awakenings.  Losing an additional 10 to 15% may reduce AHI.  After discussion of benefits and side effect she will be started on Zepbound 2.5 mg once a week to aid in her weight loss

## 2023-05-14 NOTE — Assessment & Plan Note (Signed)
Reviewed coronary artery calcium score which was 36 and at the 65th percentile.  She is aware of her cardiovascular risk and is committed to looking at ways to reduce this.  She is on a baby aspirin and is now taking rosuvastatin daily without any symptoms of myositis.  Continue to monitor.  She may also benefit from GLP-1 therapy as treatment reduces the risk of mace.

## 2023-05-14 NOTE — Assessment & Plan Note (Signed)
Presents for medical weight management. Despite significant efforts in diet and exercise, weight loss has been challenging. Discussed physiological mechanisms of weight regulation and body's resistance to weight loss. Severe sleep apnea would make her a good candidate for Zepbound.  Discussed benefits (appetite suppression, delayed gastric emptying) and risks (nausea, constipation, gastrointestinal discomfort). Emphasized gradual weight loss to avoid muscle loss and complications. Anticipated 15% weight loss to improve sleep apnea.  - Submit prescription for Zepbound to [facility] - Provide information on GLP-1 agonists and side effects - Schedule follow-up in four weeks to assess response and adjust dosage

## 2023-05-14 NOTE — Progress Notes (Signed)
Office: [phone]  /  Fax: [phone]  Weight Summary And Biometrics  Vitals Temp: 97.7 F (36.5 C) BP: 121/77 Pulse Rate: 72 SpO2: 98 %   Anthropometric Measurements Height: 5\' 3"  (1.6 m) Weight: 183 lb (83 kg) BMI (Calculated): 32.43 Weight at Last Visit: 182 lb Weight Lost Since Last Visit: 0 lb Weight Gained Since Last Visit: 1 lb Starting Weight: 187 lb Total Weight Loss (lbs): 4 lb (1.814 kg) Peak Weight: 230 lb   Body Composition  Body Fat %: 42.1 % Fat Mass (lbs): 77.2 lbs Muscle Mass (lbs): 100.6 lbs Total Body Water (lbs): 70.8 lbs Visceral Fat Rating : 12    RMR: [id number]  Today's Visit #: 4  Starting Date: 03/07/23   Subjective   Chief Complaint: Obesity  Ann Jordan is here to discuss her progress with her obesity treatment plan. She is on the the Category 2 Plan and states she is following her eating plan approximately 90 % of the time. She states she is exercising 45-60 minutes 5 times per week.  Weight Progress Since Last Visit:  Discussed the use of AI scribe software for clinical note transcription with the patient, who gave verbal consent to proceed.  History of Present Illness   The patient, affected by obesity and a history of sleep apnea, presents for medical weight management. She reports a recent weight gain of one pound despite adherence to a high-protein diet and regular exercise regimen of 45 minutes to an hour at least five days a week. The patient expresses frustration with her weight management, noting that despite her efforts, her weight remains stable.  The patient has been using a CPAP machine for her sleep apnea for over ten years. She recalls that during her last sleep study, she stopped breathing approximately thirty times a night, indicating severe sleep apnea. She reports getting about seven hours of sleep per night, but this sleep is frequently interrupted, as she wakes up two to three times a night. She also notes occasional  daytime sleepiness, particularly in the afternoons.  The patient recently started a cholesterol medication, but she is unsure of its impact on her cholesterol levels. She continues to exercise despite starting this medication and has not noticed any muscle problems. She expresses concern about potential liver issues related to her new cholesterol medication.  The patient has a history of significant weight loss around the age of 17, which she maintained for ten years. However, she reports that her weight began to increase again after menopause, despite maintaining her exercise and dietary habits. She expresses a desire for more effective weight management strategies and is open to trying new medications.      Challenges affecting patient progress: sleep apnea, menopause, and difficulty losing weight secondary to metabolic adaptations .   Orexigenic Control: Reports problems with appetite and hunger signals.  Denies problems with satiety and satiation.  Denies problems with eating patterns and portion control.  Denies abnormal cravings. Denies feeling deprived or restricted.   Pharmacotherapy for weight management: She is currently taking no anti-obesity medication.   Assessment and Plan   Treatment Plan For Obesity:  Recommended Dietary Goals  Pattie is currently in the action stage of change. As such, her goal is to continue weight management plan. She has agreed to: continue current plan  Behavioral Health and Counseling  We discussed the following behavioral modification strategies today: continue to work on maintaining a reduced calorie state, getting the recommended amount of protein, incorporating whole foods,  making healthy choices, staying well hydrated and practicing mindfulness when eating..  Additional education and resources provided today: Handout guide to GLP-1 therapy  Recommended Physical Activity Goals  Shealyn has been advised to work up to 150 minutes of  moderate intensity aerobic activity a week and strengthening exercises 2-3 times per week for cardiovascular health, weight loss maintenance and preservation of muscle mass.   She has agreed to :  continue to gradually increase the amount and intensity of exercise routine  Pharmacotherapy  We discussed various medication options to help Brayli with her weight loss efforts and we both agreed to : start anti-obesity medication.  In addition to reduced calorie nutrition plan (RCNP), behavioral strategies and physical activity, Nassim would benefit from pharmacotherapy to assist with hunger signals, satiety and cravings. This will reduce obesity-related health risks by inducing weight loss, and help reduce food consumption and adherence to [facility]) . It may also improve QOL by improving self-confidence and reduce the  setbacks associated with metabolic adaptations.  Patient also has other obesity related comorbidities including severe sleep apnea, hypercholesterolemia and ASCVD based on coronary artery calcium scores.  After discussion of treatment options, mechanisms of action, benefits, side effects, contraindications and shared decision making she is agreeable to starting Zepbound 2.5 mg once a week. Patient also made aware that medication is indicated for long-term management of obesity and the risk of weight regain following discontinuation of treatment and hence the importance of adhering to medical weight loss plan.  We demonstrated use of device and patient using teach back method was able to demonstrate proper technique.  Associated Conditions Impacted by Obesity Treatment  Class 1 obesity with serious comorbidity and body mass index (BMI) of 32.0 to 32.9 in adult, unspecified obesity type Assessment & Plan: Presents for medical weight management. Despite significant efforts in diet and exercise, weight loss has been challenging. Discussed physiological mechanisms of weight regulation and body's  resistance to weight loss. Severe sleep apnea would make her a good candidate for Zepbound.  Discussed benefits (appetite suppression, delayed gastric emptying) and risks (nausea, constipation, gastrointestinal discomfort). Emphasized gradual weight loss to avoid muscle loss and complications. Anticipated 15% weight loss to improve sleep apnea.  - Submit prescription for Zepbound to [facility] - Provide information on GLP-1 agonists and side effects - Schedule follow-up in four weeks to assess response and adjust dosage  Orders: -     Zepbound; Inject 2.5 mg into the skin once a week.  Dispense: 2 mL; Refill: 0  OSA (obstructive sleep apnea) Assessment & Plan: Patient reports having an AHI of 36 consistent with severe OSA.  She is on PAP therapy and despite good adherence she still has some dynamic time fatigue, somnolence, and nighttime awakenings.  Losing an additional 10 to 15% may reduce AHI.  After discussion of benefits and side effect she will be started on Zepbound 2.5 mg once a week to aid in her weight loss  Orders: -     Zepbound; Inject 2.5 mg into the skin once a week.  Dispense: 2 mL; Refill: 0  Coronary artery calcification seen on CAT scan Assessment & Plan: Reviewed coronary artery calcium score which was 36 and at the 65th percentile.  She is aware of her cardiovascular risk and is committed to looking at ways to reduce this.  She is on a baby aspirin and is now taking rosuvastatin daily without any symptoms of myositis.  Continue to monitor.  She may also benefit from  GLP-1 therapy as treatment reduces the risk of mace.         Objective   Physical Exam:  Blood pressure 121/77, pulse 72, temperature 97.7 F (36.5 C), height 5\' 3"  (1.6 m), weight 183 lb (83 kg), SpO2 98%. Body mass index is 32.42 kg/m.  General: She is overweight, cooperative, alert, well developed, and in no acute distress. PSYCH: Has normal mood, affect and thought process.   HEENT:  EOMI, sclerae are anicteric. Lungs: Normal breathing effort, no conversational dyspnea. Extremities: No edema.  Neurologic: No gross sensory or motor deficits. No tremors or fasciculations noted.    Diagnostic Data Reviewed:  BMET    Component Value Date/Time   NA 141 02/25/2023 1011   K 4.3 02/25/2023 1011   CL 101 02/25/2023 1011   CO2 24 02/25/2023 1011   GLUCOSE 84 02/25/2023 1011   BUN 18 02/25/2023 1011   CREATININE 0.72 02/25/2023 1011   CALCIUM 9.2 02/25/2023 1011   Lab Results  Component Value Date   HGBA1C 5.4 02/25/2023   HGBA1C 5.3 06/06/2020   Lab Results  Component Value Date   INSULIN 10.8 02/25/2023   INSULIN 8.2 06/06/2020   Lab Results  Component Value Date   TSH 1.64 02/26/2021   CBC    Component Value Date/Time   WBC 5.9 02/26/2021 0000   WBC 5.7 06/23/2012 0949   RBC 4.23 02/26/2021 0000   HGB 13.8 02/26/2021 0000   HGB 13.5 06/23/2012 0949   HCT 42 02/26/2021 0000   HCT 40.7 06/23/2012 0949   PLT 233 02/26/2021 0000   PLT 231 06/23/2012 0949   MCV 95 06/23/2012 0949   MCH 31.6 06/23/2012 0949   MCHC 33.3 06/23/2012 0949   RDW 13.5 06/23/2012 0949   Iron Studies No results found for: "IRON", "TIBC", "FERRITIN", "IRONPCTSAT" Lipid Panel     Component Value Date/Time   CHOL 237 (H) 02/25/2023 1011   TRIG 85 02/25/2023 1011   HDL 73 02/25/2023 1011   LDLCALC 149 (H) 02/25/2023 1011   Hepatic Function Panel     Component Value Date/Time   PROT 6.7 02/25/2023 1011   PROT 7.2 06/23/2012 0949   ALBUMIN 4.3 02/25/2023 1011   ALBUMIN 3.6 06/23/2012 0949   AST 16 02/25/2023 1011   AST 23 06/23/2012 0949   ALT 19 02/25/2023 1011   ALT 36 06/23/2012 0949   ALKPHOS 95 02/25/2023 1011   ALKPHOS 86 06/23/2012 0949   BILITOT 0.3 02/25/2023 1011   BILITOT 0.4 06/23/2012 0949      Component Value Date/Time   TSH 1.64 02/26/2021 0000   Nutritional Lab Results  Component Value Date   VD25OH 45.8 02/25/2023   VD25OH 63.2 03/11/2021    VD25OH 34.2 06/06/2020    Follow-Up   Return in about 4 weeks (around 06/11/2023) for For Weight Mangement with Dr. Rikki Spearing.Marland Kitchen She was informed of the importance of frequent follow up visits to maximize her success with intensive lifestyle modifications for her multiple health conditions.  Attestation Statement   Reviewed by clinician on day of visit: allergies, medications, problem list, medical history, surgical history, family history, social history, and previous encounter notes.     Worthy Rancher, MD

## 2023-05-15 DIAGNOSIS — J329 Chronic sinusitis, unspecified: Secondary | ICD-10-CM | POA: Diagnosis not present

## 2023-05-15 DIAGNOSIS — R059 Cough, unspecified: Secondary | ICD-10-CM | POA: Diagnosis not present

## 2023-05-15 DIAGNOSIS — R0981 Nasal congestion: Secondary | ICD-10-CM | POA: Diagnosis not present

## 2023-05-22 ENCOUNTER — Encounter (INDEPENDENT_AMBULATORY_CARE_PROVIDER_SITE_OTHER): Payer: Self-pay | Admitting: Internal Medicine

## 2023-05-25 ENCOUNTER — Telehealth (INDEPENDENT_AMBULATORY_CARE_PROVIDER_SITE_OTHER): Payer: Self-pay | Admitting: Internal Medicine

## 2023-05-25 ENCOUNTER — Encounter (INDEPENDENT_AMBULATORY_CARE_PROVIDER_SITE_OTHER): Payer: Self-pay

## 2023-05-25 NOTE — Telephone Encounter (Signed)
Lokesh from [facility] [employer] called to see what the diagnosis is for a Zepbound prior authorization. Please reach out to [facility] at [phone].

## 2023-06-11 ENCOUNTER — Ambulatory Visit (INDEPENDENT_AMBULATORY_CARE_PROVIDER_SITE_OTHER): Payer: PPO | Admitting: Internal Medicine

## 2023-06-22 DIAGNOSIS — Z124 Encounter for screening for malignant neoplasm of cervix: Secondary | ICD-10-CM | POA: Diagnosis not present

## 2023-06-22 DIAGNOSIS — Z01419 Encounter for gynecological examination (general) (routine) without abnormal findings: Secondary | ICD-10-CM | POA: Diagnosis not present

## 2023-06-23 ENCOUNTER — Encounter: Payer: Self-pay | Admitting: Dermatology

## 2023-06-23 ENCOUNTER — Ambulatory Visit: Payer: PPO | Admitting: Dermatology

## 2023-06-23 DIAGNOSIS — Z872 Personal history of diseases of the skin and subcutaneous tissue: Secondary | ICD-10-CM | POA: Diagnosis not present

## 2023-06-23 DIAGNOSIS — L821 Other seborrheic keratosis: Secondary | ICD-10-CM | POA: Diagnosis not present

## 2023-06-23 DIAGNOSIS — L570 Actinic keratosis: Secondary | ICD-10-CM

## 2023-06-23 DIAGNOSIS — D229 Melanocytic nevi, unspecified: Secondary | ICD-10-CM

## 2023-06-23 DIAGNOSIS — Z1283 Encounter for screening for malignant neoplasm of skin: Secondary | ICD-10-CM

## 2023-06-23 DIAGNOSIS — Z7189 Other specified counseling: Secondary | ICD-10-CM

## 2023-06-23 DIAGNOSIS — L814 Other melanin hyperpigmentation: Secondary | ICD-10-CM | POA: Diagnosis not present

## 2023-06-23 DIAGNOSIS — L578 Other skin changes due to chronic exposure to nonionizing radiation: Secondary | ICD-10-CM

## 2023-06-23 DIAGNOSIS — B351 Tinea unguium: Secondary | ICD-10-CM | POA: Diagnosis not present

## 2023-06-23 DIAGNOSIS — D1801 Hemangioma of skin and subcutaneous tissue: Secondary | ICD-10-CM | POA: Diagnosis not present

## 2023-06-23 DIAGNOSIS — W908XXA Exposure to other nonionizing radiation, initial encounter: Secondary | ICD-10-CM

## 2023-06-23 DIAGNOSIS — Z85828 Personal history of other malignant neoplasm of skin: Secondary | ICD-10-CM | POA: Diagnosis not present

## 2023-06-23 DIAGNOSIS — Z79899 Other long term (current) drug therapy: Secondary | ICD-10-CM

## 2023-06-23 MED ORDER — JUBLIA 10 % EX SOLN: CUTANEOUS | 3 refills | Status: DC

## 2023-06-23 NOTE — Progress Notes (Signed)
 Follow-Up Visit   Subjective  Ann Jordan is a 68 y.o. female who presents for the following: Skin Cancer Screening and Full Body Skin Exam  The patient presents for Total-Body Skin Exam (TBSE) for skin cancer screening and mole check. The patient has spots, moles and lesions to be evaluated, some may be new or changing. History of Aks, BCCs. She has 5FU, Calcipotriene cream that she has spot treated areas in the past with good results. She has a couple of scaly areas on the lips to check. Toenail dystrophy of the great and 2nd nails, she did not get Kerydin, non-covered.   The following portions of the chart were reviewed this encounter and updated as appropriate: medications, allergies, medical history  Review of Systems:  No other skin or systemic complaints except as noted in HPI or Assessment and Plan.  Objective  Well appearing patient in no apparent distress; mood and affect are within normal limits.  A full examination was performed including scalp, head, eyes, ears, nose, lips, neck, chest, axillae, abdomen, back, buttocks, bilateral upper extremities, bilateral lower extremities, hands, feet, fingers, toes, fingernails, and toenails. All findings within normal limits unless otherwise noted below.   Relevant physical exam findings are noted in the Assessment and Plan.  Right Upper Cutaneous Lip (topical), lower lip at vermilion bil Pink scaly macules  Assessment & Plan   SKIN CANCER SCREENING PERFORMED TODAY.  ACTINIC DAMAGE WITH PRECANCEROUS ACTINIC KERATOSES Counseling for Topical Chemotherapy Management: Patient exhibits: - Severe, confluent actinic changes with pre-cancerous actinic keratoses that is secondary to cumulative UV radiation exposure over time - Condition that is severe; chronic, not at goal. - diffuse scaly erythematous macules and papules with underlying dyspigmentation - Discussed Prescription "Field Treatment" topical Chemotherapy for Severe, Chronic  Confluent Actinic Changes with Pre-Cancerous Actinic Keratoses Field treatment involves treatment of an entire area of skin that has confluent Actinic Changes (Sun/ Ultraviolet light damage) and PreCancerous Actinic Keratoses by method of PhotoDynamic Therapy (PDT) and/or prescription Topical Chemotherapy agents such as 5-fluorouracil, 5-fluorouracil/calcipotriene, and/or imiquimod.  The purpose is to decrease the number of clinically evident and subclinical PreCancerous lesions to prevent progression to development of skin cancer by chemically destroying early precancer changes that may or may not be visible.  It has been shown to reduce the risk of developing skin cancer in the treated area. As a result of treatment, redness, scaling, crusting, and open sores may occur during treatment course. One or more than one of these methods may be used and may have to be used several times to control, suppress and eliminate the PreCancerous changes. Discussed treatment course, expected reaction, and possible side effects. - Recommend daily broad spectrum sunscreen SPF 30+ to sun-exposed areas, reapply every 2 hours as needed.  - Staying in the shade or wearing long sleeves, sun glasses (UVA+UVB protection) and wide brim hats (4-inch brim around the entire circumference of the hat) are also recommended. - Call for new or changing lesions.  LENTIGINES, SEBORRHEIC KERATOSES, HEMANGIOMAS - Benign normal skin lesions - Benign-appearing - Call for any changes  MELANOCYTIC NEVI - Tan-brown and/or pink-flesh-colored symmetric macules and papules - Benign appearing on exam today - Observation - Call clinic for new or changing moles - Recommend daily use of broad spectrum spf 30+ sunscreen to sun-exposed areas.   HISTORY OF BASAL CELL CARCINOMA OF THE SKIN R alar crease, Mohs 11/07/21 L upper earlobe, 2020 L nasal ala, 2020 - No evidence of recurrence today - Recommend  regular full body skin exams - Recommend  daily broad spectrum sunscreen SPF 30+ to sun-exposed areas, reapply every 2 hours as needed.  - Call if any new or changing lesions are noted between office visits  ONYCHOMYCOSIS Exam: Thickened toenails with subungal debris c/w onychomycosis  Chronic and persistent condition with duration or expected duration over one year. Condition is symptomatic/ bothersome to patient. Not currently at goal.   Treatment Plan: Start Jublia topical solution apply nightly to affected toenails 8 mL 3 Rf.  AK (ACTINIC KERATOSIS) Right Upper Cutaneous Lip (topical), lower lip at vermilion bil Discussed cryotherapy vs topical treatment, pt prefers topical, recheck on f/up  - Start 5-fluorouracil/calcipotriene cream twice a day for 5 days to affected areas including upper lip, lower lip (avoid creases).Patient provided with handout reviewing treatment course and side effects and advised to call or message [place] on MyChart with any concerns.  Reviewed course of treatment and expected reaction.  Patient advised to expect inflammation and crusting and advised that erosions are possible.  Patient advised to be diligent with sun protection during and after treatment. Counseled to keep medication out of reach of children and pets.  5-fluorouracil/calcipotriene cream is is a type of field treatment used to treat precancers, thin skin cancers, and areas of sun damage. Expected reaction includes irritation and mild inflammation potentially progressing to more severe inflammation including redness, scaling, crusting and open sores/erosions.  If too much irritation occurs, ensure application of only a thin layer and decrease frequency of use to achieve a tolerable level of inflammation. Recommend applying Vaseline ointment to open sores as needed.  Minimize sun exposure while under treatment. Recommend daily broad spectrum sunscreen SPF 30+ to sun-exposed areas, reapply every 2 hours as needed .  Actinic keratoses are  precancerous spots that appear secondary to cumulative UV radiation exposure/sun exposure over time. They are chronic with expected duration over 1 year. A portion of actinic keratoses will progress to squamous cell carcinoma of the skin. It is not possible to reliably predict which spots will progress to skin cancer and so treatment is recommended to prevent development of skin cancer.  Recommend daily broad spectrum sunscreen SPF 30+ to sun-exposed areas, reapply every 2 hours as needed.  Recommend staying in the shade or wearing long sleeves, sun glasses (UVA+UVB protection) and wide brim hats (4-inch brim around the entire circumference of the hat). Call for new or changing lesions.   Return in about 2 months (around 08/23/2023) for AK f/u, post 5FU.  ICherlyn Labella, CMA, am acting as scribe for Willeen Niece, MD .   Documentation: I have reviewed the above documentation for accuracy and completeness, and I agree with the above.  Willeen Niece, MD

## 2023-06-23 NOTE — Patient Instructions (Addendum)
 - Start 5-fluorouracil/calcipotriene cream twice a day for 5 days to affected areas including upper lip, lower lip (avoid creases).  Patient provided with handout reviewing treatment course and side effects and advised to call or message [place] on MyChart with any concerns.  Reviewed course of treatment and expected reaction.  Patient advised to expect inflammation and crusting and advised that erosions are possible.  Patient advised to be diligent with sun protection during and after treatment. Counseled to keep medication out of reach of children and pets.  5-Fluorouracil/Calcipotriene Patient Education   Actinic keratoses are the dry, red scaly spots on the skin caused by sun damage. A portion of these spots can turn into skin cancer with time, and treating them can help prevent development of skin cancer.   Treatment of these spots requires removal of the defective skin cells. There are various ways to remove actinic keratoses, including freezing with liquid nitrogen, treatment with creams, or treatment with a blue light procedure in the office.   5-fluorouracil cream is a topical cream used to treat actinic keratoses. It works by interfering with the growth of abnormal fast-growing skin cells, such as actinic keratoses. These cells peel off and are replaced by healthy ones.   5-fluorouracil/calcipotriene is a combination of the 5-fluorouracil cream with a vitamin D analog cream called calcipotriene. The calcipotriene alone does not treat actinic keratoses. However, when it is combined with 5-fluorouracil, it helps the 5-fluorouracil treat the actinic keratoses much faster so that the same results can be achieved with a much shorter treatment time.  INSTRUCTIONS FOR 5-FLUOROURACIL/CALCIPOTRIENE CREAM:   5-fluorouracil/calcipotriene cream typically only needs to be used for 4-7 days. A thin layer should be applied twice a day to the treatment areas recommended by your physician.   If your physician  prescribed you separate tubes of 5-fluourouracil and calcipotriene, apply a thin layer of 5-fluorouracil followed by a thin layer of calcipotriene.   Avoid contact with your eyes, nostrils, and mouth. Do not use 5-fluorouracil/calcipotriene cream on infected or open wounds.   You will develop redness, irritation and some crusting at areas where you have pre-cancer damage/actinic keratoses. IF YOU DEVELOP PAIN, BLEEDING, OR SIGNIFICANT CRUSTING, STOP THE TREATMENT EARLY - you have already gotten a good response and the actinic keratoses should clear up well.  Wash your hands after applying 5-fluorouracil 5% cream on your skin.   A moisturizer or sunscreen with a minimum SPF 30 should be applied each morning.   Once you have finished the treatment, you can apply a thin layer of Vaseline twice a day to irritated areas to soothe and calm the areas more quickly. If you experience significant discomfort, contact your physician.  For some patients it is necessary to repeat the treatment for best results.  SIDE EFFECTS: When using 5-fluorouracil/calcipotriene cream, you may have mild irritation, such as redness, dryness, swelling, or a mild burning sensation. This usually resolves within 2 weeks. The more actinic keratoses you have, the more redness and inflammation you can expect during treatment. Eye irritation has been reported rarely. If this occurs, please let us know.  If you have any trouble using this cream, please call the office. If you have any other questions about this information, please do not hesitate to ask me before you leave the office.  Recommend starting moisturizer with exfoliant (Urea, Salicylic acid, or Lactic acid) one to two times daily to help smooth rough and bumpy skin.  OTC options include Cetaphil Rough and Bumpy lotion (Urea),  Eucerin Roughness Relief lotion or spot treatment cream (Urea), CeraVe SA lotion/cream for Rough and Bumpy skin (Sal Acid), Gold Bond Rough and Bumpy  cream (Sal Acid), and AmLactin 12% lotion/cream (Lactic Acid).  If applying in morning, also apply sunscreen to sun-exposed areas, since these exfoliating moisturizers can increase sensitivity to sun.  Seborrheic Keratosis  What causes seborrheic keratoses? Seborrheic keratoses are harmless, common skin growths that first appear during adult life.  As time goes by, more growths appear.  Some people may develop a large number of them.  Seborrheic keratoses appear on both covered and uncovered body parts.  They are not caused by sunlight.  The tendency to develop seborrheic keratoses can be inherited.  They vary in color from skin-colored to gray, brown, or even black.  They can be either smooth or have a rough, warty surface.   Seborrheic keratoses are superficial and look as if they were stuck on the skin.  Under the microscope this type of keratosis looks like layers upon layers of skin.  That is why at times the top layer may seem to fall off, but the rest of the growth remains and re-grows.    Treatment Seborrheic keratoses do not need to be treated, but can easily be removed in the office.  Seborrheic keratoses often cause symptoms when they rub on clothing or jewelry.  Lesions can be in the way of shaving.  If they become inflamed, they can cause itching, soreness, or burning.  Removal of a seborrheic keratosis can be accomplished by freezing, burning, or surgery. If any spot bleeds, scabs, or grows rapidly, please return to have it checked, as these can be an indication of a skin cancer.  Due to recent changes in healthcare laws, you may see results of your pathology and/or laboratory studies on MyChart before the doctors have had a chance to review them. We understand that in some cases there may be results that are confusing or concerning to you. Please understand that not all results are received at the same time and often the doctors may need to interpret multiple results in order to provide  you with the best plan of care or course of treatment. Therefore, we ask that you please give [place] 2 business days to thoroughly review all your results before contacting the office for clarification. Should we see a critical lab result, you will be contacted sooner.   If You Need Anything After Your Visit  If you have any questions or concerns for your doctor, please call our main line at [phone] and press option 4 to reach your doctor's medical assistant. If no one answers, please leave a voicemail as directed and we will return your call as soon as possible. Messages left after 4 pm will be answered the following business day.   You may also send [place] a message via MyChart. We typically respond to MyChart messages within 1-2 business days.  For prescription refills, please ask your pharmacy to contact our office. Our fax number is [phone].  If you have an urgent issue when the clinic is closed that cannot wait until the next business day, you can page your doctor at the number below.    Please note that while we do our best to be available for urgent issues outside of office hours, we are not available 24/7.   If you have an urgent issue and are unable to reach [place], you may choose to seek medical care at your doctor's office, retail  clinic, urgent care center, or emergency room.  If you have a medical emergency, please immediately call 911 or go to the emergency department.  Pager Numbers  - Dr. Gwen Pounds: [phone]  - Dr. Roseanne Reno: [phone]  - Dr. Katrinka Blazing: [phone]   In the event of inclement weather, please call our main line at [phone] for an update on the status of any delays or closures.  Dermatology Medication Tips: Please keep the boxes that topical medications come in in order to help keep track of the instructions about where and how to use these. Pharmacies typically print the medication instructions only on the boxes and not directly on the medication  tubes.   If your medication is too expensive, please contact our office at [phone] option 4 or send [place] a message through MyChart.   We are unable to tell what your co-pay for medications will be in advance as this is different depending on your insurance coverage. However, we may be able to find a substitute medication at lower cost or fill out paperwork to get insurance to cover a needed medication.   If a prior authorization is required to get your medication covered by your insurance company, please allow [place] 1-2 business days to complete this process.  Drug prices often vary depending on where the prescription is filled and some pharmacies may offer cheaper prices.  The website www.goodrx.com contains coupons for medications through different pharmacies. The prices here do not account for what the cost may be with help from insurance (it may be cheaper with your insurance), but the website can give you the price if you did not use any insurance.  - You can print the associated coupon and take it with your prescription to the pharmacy.  - You may also stop by our office during regular business hours and pick up a GoodRx coupon card.  - If you need your prescription sent electronically to a different pharmacy, notify our office through [facility] or by phone at [phone] option 4.     Si Usted Necesita Algo Despus de Su Visita  Tambin puede enviarnos un mensaje a travs de Clinical cytogeneticist. Por lo general respondemos a los mensajes de MyChart en el transcurso de 1 a 2 das hbiles.  Para renovar recetas, por favor pida a su farmacia que se ponga en contacto con nuestra oficina. Annie Sable de fax es [place] [phone].  Si tiene un asunto urgente cuando la clnica est cerrada y que no puede esperar hasta el siguiente da hbil, puede llamar/localizar a su doctor(a) al nmero que aparece a continuacin.   Por favor, tenga en cuenta que aunque hacemos todo lo posible para estar  disponibles para asuntos urgentes fuera del horario de [place], no estamos disponibles las 24 horas del da, los 7 [address]  [address] de la [place].   Si tiene un problema urgente y no puede comunicarse con nosotros, puede optar por buscar atencin mdica  en el consultorio de su doctor(a), en una clnica privada, en un centro de atencin urgente o en una sala de emergencias.  Si tiene Engineer, drilling, por favor llame inmediatamente al 911 o vaya a la sala de emergencias.  Nmeros de bper  - Dr. Gwen Pounds: [phone]  - Dra. Roseanne Reno: [phone]  - Dr. Katrinka Blazing: [phone]   En caso de inclemencias del tiempo, por favor llame a Lacy Duverney principal al [phone] para una actualizacin sobre el [place] de cualquier retraso o cierre.  Consejos para la medicacin en dermatologa: Por favor,  guarde las cajas en las que vienen los medicamentos de uso tpico para ayudarle a seguir las instrucciones sobre dnde y cmo usarlos. Las farmacias generalmente imprimen las instrucciones del medicamento slo en las cajas y no directamente en los tubos del [place].   Si su medicamento es muy caro, por favor, pngase en contacto con Rolm Gala llamando al [phone] y presione la opcin 4 o envenos un mensaje a travs de Clinical cytogeneticist.   No podemos decirle cul ser su copago por los medicamentos por adelantado ya que esto es diferente dependiendo de la cobertura de su seguro. Sin embargo, es posible que podamos encontrar un medicamento sustituto a Audiological scientist un formulario para que el seguro cubra el medicamento que se considera necesario.   Si se requiere una autorizacin previa para que su compaa de seguros [place] su medicamento, por favor permtanos de 1 a 2 das hbiles para completar [address]  Los precios de los medicamentos varan con frecuencia dependiendo del Environmental consultant de dnde se surte la receta y alguna farmacias pueden ofrecer precios ms baratos.  El sitio web www.goodrx.com  tiene cupones para medicamentos de Health and safety inspector. Los precios aqu no tienen en cuenta lo que podra costar con la ayuda del seguro (puede ser ms barato con su seguro), pero el sitio web puede darle el precio si no utiliz Tourist information centre manager.  - Puede imprimir el cupn correspondiente y llevarlo con su receta a la farmacia.  - Tambin puede pasar por nuestra oficina durante el horario de atencin regular y Education officer, museum una tarjeta de cupones de GoodRx.  - Si necesita que su receta se enve electrnicamente a una farmacia diferente, informe a nuestra oficina a travs de MyChart de [place] o por telfono llamando al [phone] y presione la opcin 4.

## 2023-06-24 ENCOUNTER — Other Ambulatory Visit: Payer: Self-pay

## 2023-06-24 MED ORDER — TAVABOROLE 5 % EX SOLN: 1.0000 | Freq: Every day | CUTANEOUS | 11 refills | Status: AC

## 2023-07-14 ENCOUNTER — Ambulatory Visit (INDEPENDENT_AMBULATORY_CARE_PROVIDER_SITE_OTHER): Payer: PPO | Admitting: Internal Medicine

## 2023-07-21 ENCOUNTER — Encounter (INDEPENDENT_AMBULATORY_CARE_PROVIDER_SITE_OTHER): Payer: Self-pay

## 2023-08-26 DIAGNOSIS — Z1151 Encounter for screening for human papillomavirus (HPV): Secondary | ICD-10-CM | POA: Diagnosis not present

## 2023-08-26 DIAGNOSIS — R87615 Unsatisfactory cytologic smear of cervix: Secondary | ICD-10-CM | POA: Diagnosis not present

## 2023-09-07 ENCOUNTER — Ambulatory Visit: Admitting: Dermatology

## 2023-09-07 DIAGNOSIS — Z79899 Other long term (current) drug therapy: Secondary | ICD-10-CM | POA: Diagnosis not present

## 2023-09-07 DIAGNOSIS — L570 Actinic keratosis: Secondary | ICD-10-CM | POA: Diagnosis not present

## 2023-09-07 NOTE — Progress Notes (Signed)
   Follow-Up Visit   Subjective  Ann Jordan is a 68 y.o. female who presents for the following: AK 62m f/u used 5FU/Calcipotriene upper lip, lower lip for 2 days then had to d/c got red and painful Still scaly on lower lip, but upper lip seems improved.   The following portions of the chart were reviewed this encounter and updated as appropriate: medications, allergies, medical history  Review of Systems:  No other skin or systemic complaints except as noted in HPI or Assessment and Plan.  Objective  Well appearing patient in no apparent distress; mood and affect are within normal limits.   A focused examination was performed of the following areas: face  Relevant exam findings are noted in the Assessment and Plan.  R lower lip at vermillion edge Pink scaly macules  Assessment & Plan   HISTORY of Aks Upper lip, lower lip at vermillion Some improvement from previous 5FU/Calcipotriene bid x 2 days Exam:  Treatment Plan: Recommend restarting 5FU/Calcipotriene cream bid for 3-4 days as tolerated to upper cutaneous lip and lower lip at vermilion edge, or qd until red and irritated   AK (ACTINIC KERATOSIS) R lower lip at vermillion edge Actinic keratoses are precancerous spots that appear secondary to cumulative UV radiation exposure/sun exposure over time. They are chronic with expected duration over 1 year. A portion of actinic keratoses will progress to squamous cell carcinoma of the skin. It is not possible to reliably predict which spots will progress to skin cancer and so treatment is recommended to prevent development of skin cancer.  Recommend daily broad spectrum sunscreen SPF 30+ to sun-exposed areas, reapply every 2 hours as needed.  Recommend staying in the shade or wearing long sleeves, sun glasses (UVA+UVB protection) and wide brim hats (4-inch brim around the entire circumference of the hat). Call for new or changing lesions.  Pt declines LN2 today due to  upcoming event  Discussed starting 5FU/Calcipotriene bid for 2-5 days, plan up to 2 weeks to heal.  5-fluorouracil /calcipotriene cream is is a type of field treatment used to treat precancers, thin skin cancers, and areas of sun damage. Expected reaction includes irritation and mild inflammation potentially progressing to more severe inflammation including redness, scaling, crusting and open sores/erosions.  If too much irritation occurs, ensure application of only a thin layer and decrease frequency of use to achieve a tolerable level of inflammation. Recommend applying Vaseline ointment to open sores as needed.  Minimize sun exposure while under treatment. Recommend daily broad spectrum sunscreen SPF 30+ to sun-exposed areas, reapply every 2 hours as needed.        Return in about 6 months (around 03/09/2024) for AK f/u.  I, Rollie Clipper, RMA, am acting as scribe for Artemio Larry, MD .   Documentation: I have reviewed the above documentation for accuracy and completeness, and I agree with the above.  Artemio Larry, MD

## 2023-09-07 NOTE — Patient Instructions (Addendum)
 Recommend restarting 5FU/Calcipotriene cream twice a day for 2-4 days as tolerated, or once a day until red and irritated to upper lip, and right lower lip   Due to recent changes in healthcare laws, you may see results of your pathology and/or laboratory studies on MyChart before the doctors have had a chance to review them. We understand that in some cases there may be results that are confusing or concerning to you. Please understand that not all results are received at the same time and often the doctors may need to interpret multiple results in order to provide you with the best plan of care or course of treatment. Therefore, we ask that you please give us  2 business days to thoroughly review all your results before contacting the office for clarification. Should we see a critical lab result, you will be contacted sooner.   If You Need Anything After Your Visit  If you have any questions or concerns for your doctor, please call our main line at [phone] and press option 4 to reach your doctor's medical assistant. If no one answers, please leave a voicemail as directed and we will return your call as soon as possible. Messages left after 4 pm will be answered the following business day.   You may also send us  a message via MyChart. We typically respond to MyChart messages within 1-2 business days.  For prescription refills, please ask your pharmacy to contact our office. Our fax number is [phone].  If you have an urgent issue when the clinic is closed that cannot wait until the next business day, you can page your doctor at the number below.    Please note that while we do our best to be available for urgent issues outside of office hours, we are not available 24/7.   If you have an urgent issue and are unable to reach us , you may choose to seek medical care at your doctor's office, retail clinic, urgent care center, or emergency room.  If you have a medical emergency, please  immediately call 911 or go to the emergency department.  Pager Numbers  - Dr. Bary Likes: [phone]  - Dr. Annette Barters: [phone]  - Dr. Felipe Horton: [phone]   In the event of inclement weather, please call our main line at [phone] for an update on the status of any delays or closures.  Dermatology Medication Tips: Please keep the boxes that topical medications come in in order to help keep track of the instructions about where and how to use these. Pharmacies typically print the medication instructions only on the boxes and not directly on the medication tubes.   If your medication is too expensive, please contact our office at [phone] option 4 or send us  a message through MyChart.   We are unable to tell what your co-pay for medications will be in advance as this is different depending on your insurance coverage. However, we may be able to find a substitute medication at lower cost or fill out paperwork to get insurance to cover a needed medication.   If a prior authorization is required to get your medication covered by your insurance company, please allow us  1-2 business days to complete this process.  Drug prices often vary depending on where the prescription is filled and some pharmacies may offer cheaper prices.  The website www.goodrx.com contains coupons for medications through different pharmacies. The prices here do not account for what the cost may be with help from insurance (it may be cheaper  with your insurance), but the website can give you the price if you did not use any insurance.  - You can print the associated coupon and take it with your prescription to the pharmacy.  - You may also stop by our office during regular business hours and pick up a GoodRx coupon card.  - If you need your prescription sent electronically to a different pharmacy, notify our office through [facility] or by phone at [phone] option 4.     Si Usted Necesita Algo  Despus de Su Visita  Tambin puede enviarnos un mensaje a travs de Clinical cytogeneticist. Por lo general respondemos a los mensajes de MyChart en el transcurso de 1 a 2 das hbiles.  Para renovar recetas, por favor pida a su farmacia que se ponga en contacto con nuestra oficina. Franz Jacks de fax es [place] [phone].  Si tiene un asunto urgente cuando la clnica est cerrada y que no puede esperar hasta el siguiente da hbil, puede llamar/localizar a su doctor(a) al nmero que aparece a continuacin.   Por favor, tenga en cuenta que aunque hacemos todo lo posible para estar disponibles para asuntos urgentes fuera del horario de [place], no estamos disponibles las 24 horas del da, los 7 [address]  [address] de la [place].   Si tiene un problema urgente y no puede comunicarse con nosotros, puede optar por buscar atencin mdica  en el consultorio de su doctor(a), en una clnica privada, en un centro de atencin urgente o en una sala de emergencias.  Si tiene Engineer, drilling, por favor llame inmediatamente al 911 o vaya a la sala de emergencias.  Nmeros de bper  - Dr. Bary Likes: [phone]  - Dra. Annette Barters: [phone]  - Dr. Felipe Horton: [phone]   En caso de inclemencias del tiempo, por favor llame a Lajuan Pila principal al [phone] para una actualizacin sobre el [place] de cualquier retraso o cierre.  Consejos para la medicacin en dermatologa: Por favor, guarde las cajas en las que vienen los medicamentos de uso tpico para ayudarle a seguir las instrucciones sobre dnde y cmo usarlos. Las farmacias generalmente imprimen las instrucciones del medicamento slo en las cajas y no directamente en los tubos del [place].   Si su medicamento es muy caro, por favor, pngase en contacto con Bettyjane Brunet llamando al [phone] y presione la opcin 4 o envenos un mensaje a travs de Clinical cytogeneticist.   No podemos decirle cul ser su copago por los medicamentos por adelantado ya que esto es diferente  dependiendo de la cobertura de su seguro. Sin embargo, es posible que podamos encontrar un medicamento sustituto a Audiological scientist un formulario para que el seguro cubra el medicamento que se considera necesario.   Si se requiere una autorizacin previa para que su compaa de seguros [place] su medicamento, por favor permtanos de 1 a 2 das hbiles para completar este proceso.  Los precios de los medicamentos varan con frecuencia dependiendo del Environmental consultant de dnde se surte la receta y alguna farmacias pueden ofrecer precios ms baratos.  El sitio web www.goodrx.com tiene cupones para medicamentos de Health and safety inspector. Los precios aqu no tienen en cuenta lo que podra costar con la ayuda del seguro (puede ser ms barato con su seguro), pero el sitio web puede darle el precio si no utiliz Tourist information centre manager.  - Puede imprimir el cupn correspondiente y llevarlo con su receta a la farmacia.  - Tambin puede pasar por nuestra oficina durante el horario de atencin regular y Education officer, museum  una tarjeta de cupones de GoodRx.  - Si necesita que su receta se enve electrnicamente a una farmacia diferente, informe a nuestra oficina a travs de MyChart de [place] o por telfono llamando al [phone] y presione la opcin 4.

## 2023-09-21 DIAGNOSIS — M3313 Other dermatomyositis without myopathy: Secondary | ICD-10-CM | POA: Diagnosis not present

## 2023-09-21 DIAGNOSIS — K219 Gastro-esophageal reflux disease without esophagitis: Secondary | ICD-10-CM | POA: Diagnosis not present

## 2023-09-21 DIAGNOSIS — E7849 Other hyperlipidemia: Secondary | ICD-10-CM | POA: Diagnosis not present

## 2023-09-21 DIAGNOSIS — Z131 Encounter for screening for diabetes mellitus: Secondary | ICD-10-CM | POA: Diagnosis not present

## 2023-09-28 DIAGNOSIS — R918 Other nonspecific abnormal finding of lung field: Secondary | ICD-10-CM | POA: Diagnosis not present

## 2023-09-28 DIAGNOSIS — G4733 Obstructive sleep apnea (adult) (pediatric): Secondary | ICD-10-CM | POA: Diagnosis not present

## 2023-09-28 DIAGNOSIS — M0580 Other rheumatoid arthritis with rheumatoid factor of unspecified site: Secondary | ICD-10-CM | POA: Diagnosis not present

## 2023-09-28 DIAGNOSIS — M3313 Other dermatomyositis without myopathy: Secondary | ICD-10-CM | POA: Diagnosis not present

## 2023-09-28 DIAGNOSIS — Z0000 Encounter for general adult medical examination without abnormal findings: Secondary | ICD-10-CM | POA: Diagnosis not present

## 2023-09-28 DIAGNOSIS — I2510 Atherosclerotic heart disease of native coronary artery without angina pectoris: Secondary | ICD-10-CM | POA: Diagnosis not present

## 2023-09-28 DIAGNOSIS — Z1331 Encounter for screening for depression: Secondary | ICD-10-CM | POA: Diagnosis not present

## 2023-09-28 DIAGNOSIS — F3342 Major depressive disorder, recurrent, in full remission: Secondary | ICD-10-CM | POA: Diagnosis not present

## 2023-09-28 DIAGNOSIS — E7849 Other hyperlipidemia: Secondary | ICD-10-CM | POA: Diagnosis not present

## 2023-10-19 ENCOUNTER — Other Ambulatory Visit: Payer: Self-pay | Admitting: Certified Nurse Midwife

## 2023-10-19 DIAGNOSIS — Z1231 Encounter for screening mammogram for malignant neoplasm of breast: Secondary | ICD-10-CM

## 2023-10-20 DIAGNOSIS — M3313 Other dermatomyositis without myopathy: Secondary | ICD-10-CM | POA: Diagnosis not present

## 2023-10-30 ENCOUNTER — Other Ambulatory Visit: Payer: Self-pay

## 2023-10-30 ENCOUNTER — Emergency Department
Admission: EM | Admit: 2023-10-30 | Discharge: 2023-10-30 | Discharge status: Against Medical Advice | Attending: Emergency Medicine | Admitting: Emergency Medicine

## 2023-10-30 DIAGNOSIS — R1032 Left lower quadrant pain: Secondary | ICD-10-CM | POA: Insufficient documentation

## 2023-10-30 DIAGNOSIS — Z5321 Procedure and treatment not carried out due to patient leaving prior to being seen by health care provider: Secondary | ICD-10-CM | POA: Insufficient documentation

## 2023-10-30 DIAGNOSIS — R110 Nausea: Secondary | ICD-10-CM | POA: Insufficient documentation

## 2023-10-30 LAB — COMPREHENSIVE METABOLIC PANEL WITH GFR
ALT: 23 U/L (ref 0–44)
AST: 21 U/L (ref 15–41)
Albumin: 4 g/dL (ref 3.5–5.0)
Alkaline Phosphatase: 73 U/L (ref 38–126)
Anion gap: 8 (ref 5–15)
BUN: 12 mg/dL (ref 8–23)
CO2: 26 mmol/L (ref 22–32)
Calcium: 9.3 mg/dL (ref 8.9–10.3)
Chloride: 106 mmol/L (ref 98–111)
Creatinine, Ser: 0.7 mg/dL (ref 0.44–1.00)
GFR, Estimated: >60 mL/min (ref >60)
Glucose, Bld: 88 mg/dL (ref 70–99)
Potassium: 4 mmol/L (ref 3.5–5.1)
Sodium: 140 mmol/L (ref 135–145)
Total Bilirubin: 0.6 mg/dL (ref 0.0–1.2)
Total Protein: 7.3 g/dL (ref 6.5–8.1)

## 2023-10-30 LAB — URINALYSIS, ROUTINE W REFLEX MICROSCOPIC
Bacteria, UA: NONE SEEN
Bilirubin Urine: NEGATIVE
Glucose, UA: NEGATIVE mg/dL
Hgb urine dipstick: NEGATIVE
Ketones, ur: NEGATIVE mg/dL
Nitrite: NEGATIVE
Protein, ur: NEGATIVE mg/dL
RBC / HPF: 0 RBC/hpf (ref 0–5)
Specific Gravity, Urine: 1.004 — ABNORMAL LOW (ref 1.005–1.030)
pH: 6 (ref 5.0–8.0)

## 2023-10-30 LAB — CBC
HCT: 41.2 % (ref 36.0–46.0)
Hemoglobin: 13.6 g/dL (ref 12.0–15.0)
MCH: 32.7 pg (ref 26.0–34.0)
MCHC: 33 g/dL (ref 30.0–36.0)
MCV: 99 fL (ref 80.0–100.0)
Platelets: 227 K/uL (ref 150–400)
RBC: 4.16 MIL/uL (ref 3.87–5.11)
RDW: 12.8 % (ref 11.5–15.5)
WBC: 6.9 K/uL (ref 4.0–10.5)
nRBC: 0 % (ref 0.0–0.2)

## 2023-10-30 LAB — LIPASE, BLOOD: Lipase: 31 U/L (ref 11–51)

## 2023-10-30 NOTE — ED Notes (Signed)
 Pt to desk asking about wait times. Pt states I am in pain and am tired of sitting here. Pt educated to stay. Pt left anyways.

## 2023-10-30 NOTE — ED Triage Notes (Addendum)
 Pt to ED via POV from home. Pt reports LLQ pain x1 wk with nausea. Pain radiates to lower back. Pt reports started zepbound . Pt reports decreased appetite x2 days. Pt reports has been taking 500mg  amoxicillin since Monday.

## 2023-10-30 NOTE — ED Provider Triage Note (Signed)
 Emergency Medicine Provider Triage Evaluation Note  CHALSEA DARKO , a 68 y.o. female  was evaluated in triage.  Pt complains of LLQ pain and nausea radiating to lower back with decreased appetite. Takes zepbound  for weight loss. No hx of AAA. Has been taking 500 mg amoxicillin at home that she had leftover from somewhere else.   Physical Exam  BP (!) 143/70   Pulse 76   Temp 98.1 F (36.7 C) (Oral)   Resp 20   SpO2 98%  Gen:   Awake, no distress   Resp:  Normal effort  MSK:   Moves extremities without difficulty  Other:  TTP in LLQ.   Medical Decision Making  Medically screening exam initiated at 5:45 PM.  Appropriate orders placed.  MICHAELYN WALL was informed that the remainder of the evaluation will be completed by another provider, this initial triage assessment does not replace that evaluation, and the importance of remaining in the ED until their evaluation is complete.  CBC, CMP, lipase, UA   Sheron Salm, PA-C 10/30/23 1749

## 2023-11-02 DIAGNOSIS — Z791 Long term (current) use of non-steroidal anti-inflammatories (NSAID): Secondary | ICD-10-CM | POA: Diagnosis not present

## 2023-11-02 DIAGNOSIS — G8929 Other chronic pain: Secondary | ICD-10-CM | POA: Diagnosis not present

## 2023-11-02 DIAGNOSIS — M0580 Other rheumatoid arthritis with rheumatoid factor of unspecified site: Secondary | ICD-10-CM | POA: Diagnosis not present

## 2023-11-02 DIAGNOSIS — Z8739 Personal history of other diseases of the musculoskeletal system and connective tissue: Secondary | ICD-10-CM | POA: Diagnosis not present

## 2023-11-02 DIAGNOSIS — M25562 Pain in left knee: Secondary | ICD-10-CM | POA: Diagnosis not present

## 2023-11-08 DIAGNOSIS — Z853 Personal history of malignant neoplasm of breast: Secondary | ICD-10-CM | POA: Diagnosis not present

## 2023-11-08 DIAGNOSIS — R109 Unspecified abdominal pain: Secondary | ICD-10-CM | POA: Diagnosis not present

## 2023-11-08 DIAGNOSIS — E278 Other specified disorders of adrenal gland: Secondary | ICD-10-CM | POA: Diagnosis not present

## 2023-11-08 DIAGNOSIS — R9431 Abnormal electrocardiogram [ECG] [EKG]: Secondary | ICD-10-CM | POA: Diagnosis not present

## 2023-11-08 DIAGNOSIS — K5732 Diverticulitis of large intestine without perforation or abscess without bleeding: Secondary | ICD-10-CM | POA: Diagnosis not present

## 2023-11-09 DIAGNOSIS — K5792 Diverticulitis of intestine, part unspecified, without perforation or abscess without bleeding: Secondary | ICD-10-CM | POA: Diagnosis not present

## 2023-11-09 DIAGNOSIS — E279 Disorder of adrenal gland, unspecified: Secondary | ICD-10-CM | POA: Diagnosis not present

## 2023-11-09 DIAGNOSIS — E278 Other specified disorders of adrenal gland: Secondary | ICD-10-CM | POA: Diagnosis not present

## 2023-11-09 DIAGNOSIS — D3502 Benign neoplasm of left adrenal gland: Secondary | ICD-10-CM | POA: Diagnosis not present

## 2023-11-10 ENCOUNTER — Other Ambulatory Visit: Payer: Self-pay | Admitting: Physician Assistant

## 2023-11-10 DIAGNOSIS — E279 Disorder of adrenal gland, unspecified: Secondary | ICD-10-CM

## 2023-11-10 DIAGNOSIS — D3502 Benign neoplasm of left adrenal gland: Secondary | ICD-10-CM

## 2023-11-10 DIAGNOSIS — E278 Other specified disorders of adrenal gland: Secondary | ICD-10-CM

## 2023-11-11 ENCOUNTER — Ambulatory Visit
Admission: RE | Admit: 2023-11-11 | Discharge: 2023-11-11 | Disposition: A | Discharge status: Home / Self Care | Source: Ambulatory Visit | Attending: Physician Assistant | Admitting: Physician Assistant

## 2023-11-11 ENCOUNTER — Other Ambulatory Visit: Payer: Self-pay | Admitting: Physician Assistant

## 2023-11-11 DIAGNOSIS — D3502 Benign neoplasm of left adrenal gland: Secondary | ICD-10-CM | POA: Insufficient documentation

## 2023-11-11 DIAGNOSIS — E278 Other specified disorders of adrenal gland: Secondary | ICD-10-CM | POA: Insufficient documentation

## 2023-11-11 DIAGNOSIS — E279 Disorder of adrenal gland, unspecified: Secondary | ICD-10-CM | POA: Diagnosis not present

## 2023-11-11 DIAGNOSIS — K429 Umbilical hernia without obstruction or gangrene: Secondary | ICD-10-CM | POA: Diagnosis not present

## 2023-11-11 DIAGNOSIS — K5730 Diverticulosis of large intestine without perforation or abscess without bleeding: Secondary | ICD-10-CM | POA: Diagnosis not present

## 2023-11-25 ENCOUNTER — Ambulatory Visit
Admission: RE | Admit: 2023-11-25 | Discharge: 2023-11-25 | Disposition: A | Discharge status: Home / Self Care | Source: Ambulatory Visit | Attending: Certified Nurse Midwife | Admitting: Certified Nurse Midwife

## 2023-11-25 DIAGNOSIS — Z1231 Encounter for screening mammogram for malignant neoplasm of breast: Secondary | ICD-10-CM | POA: Diagnosis not present

## 2023-12-02 DIAGNOSIS — G4733 Obstructive sleep apnea (adult) (pediatric): Secondary | ICD-10-CM | POA: Diagnosis not present

## 2024-01-18 DIAGNOSIS — E279 Disorder of adrenal gland, unspecified: Secondary | ICD-10-CM | POA: Diagnosis not present

## 2024-01-18 DIAGNOSIS — E6609 Other obesity due to excess calories: Secondary | ICD-10-CM | POA: Diagnosis not present

## 2024-01-18 DIAGNOSIS — E66811 Obesity, class 1: Secondary | ICD-10-CM | POA: Diagnosis not present

## 2024-01-18 DIAGNOSIS — Z6834 Body mass index (BMI) 34.0-34.9, adult: Secondary | ICD-10-CM | POA: Diagnosis not present

## 2024-01-21 DIAGNOSIS — E66811 Obesity, class 1: Secondary | ICD-10-CM | POA: Diagnosis not present

## 2024-01-21 DIAGNOSIS — Z6834 Body mass index (BMI) 34.0-34.9, adult: Secondary | ICD-10-CM | POA: Diagnosis not present

## 2024-01-21 DIAGNOSIS — E6609 Other obesity due to excess calories: Secondary | ICD-10-CM | POA: Diagnosis not present

## 2024-02-23 ENCOUNTER — Ambulatory Visit: Admitting: Dermatology

## 2024-03-22 DIAGNOSIS — M3313 Other dermatomyositis without myopathy: Secondary | ICD-10-CM | POA: Diagnosis not present

## 2024-03-22 DIAGNOSIS — M0580 Other rheumatoid arthritis with rheumatoid factor of unspecified site: Secondary | ICD-10-CM | POA: Diagnosis not present

## 2024-03-22 DIAGNOSIS — E7849 Other hyperlipidemia: Secondary | ICD-10-CM | POA: Diagnosis not present

## 2024-03-23 ENCOUNTER — Ambulatory Visit: Admitting: Dermatology

## 2024-03-23 DIAGNOSIS — Z7189 Other specified counseling: Secondary | ICD-10-CM | POA: Diagnosis not present

## 2024-03-23 DIAGNOSIS — L578 Other skin changes due to chronic exposure to nonionizing radiation: Secondary | ICD-10-CM

## 2024-03-23 DIAGNOSIS — L570 Actinic keratosis: Secondary | ICD-10-CM | POA: Diagnosis not present

## 2024-03-23 DIAGNOSIS — W908XXA Exposure to other nonionizing radiation, initial encounter: Secondary | ICD-10-CM | POA: Diagnosis not present

## 2024-03-23 DIAGNOSIS — Z79899 Other long term (current) drug therapy: Secondary | ICD-10-CM

## 2024-03-23 NOTE — Patient Instructions (Signed)
 5-Fluorouracil /Calcipotriene Patient Education   Actinic keratoses are the dry, red scaly spots on the skin caused by sun damage. A portion of these spots can turn into skin cancer with time, and treating them can help prevent development of skin cancer.   Treatment of these spots requires removal of the defective skin cells. There are various ways to remove actinic keratoses, including freezing with liquid nitrogen, treatment with creams, or treatment with a blue light procedure in the office.   5-fluorouracil  cream is a topical cream used to treat actinic keratoses. It works by interfering with the growth of abnormal fast-growing skin cells, such as actinic keratoses. These cells peel off and are replaced by healthy ones.   5-fluorouracil /calcipotriene is a combination of the 5-fluorouracil  cream with a vitamin D  analog cream called calcipotriene. The calcipotriene alone does not treat actinic keratoses. However, when it is combined with 5-fluorouracil , it helps the 5-fluorouracil  treat the actinic keratoses much faster so that the same results can be achieved with a much shorter treatment time.  INSTRUCTIONS FOR 5-FLUOROURACIL /CALCIPOTRIENE CREAM:   5-fluorouracil /calcipotriene cream typically only needs to be used for 4-7 days. A thin layer should be applied twice a day to the treatment areas recommended by your physician.   If your physician prescribed you separate tubes of 5-fluourouracil and calcipotriene, apply a thin layer of 5-fluorouracil  followed by a thin layer of calcipotriene.   Avoid contact with your eyes, nostrils, and mouth. Do not use 5-fluorouracil /calcipotriene cream on infected or open wounds.   You will develop redness, irritation and some crusting at areas where you have pre-cancer damage/actinic keratoses. IF YOU DEVELOP PAIN, BLEEDING, OR SIGNIFICANT CRUSTING, STOP THE TREATMENT EARLY - you have already gotten a good response and the actinic keratoses should clear up  well.  Wash your hands after applying 5-fluorouracil  5% cream on your skin.   A moisturizer or sunscreen with a minimum SPF 30 should be applied each morning.   Once you have finished the treatment, you can apply a thin layer of Vaseline twice a day to irritated areas to soothe and calm the areas more quickly. If you experience significant discomfort, contact your physician.  For some patients it is necessary to repeat the treatment for best results.  SIDE EFFECTS: When using 5-fluorouracil /calcipotriene cream, you may have mild irritation, such as redness, dryness, swelling, or a mild burning sensation. This usually resolves within 2 weeks. The more actinic keratoses you have, the more redness and inflammation you can expect during treatment. Eye irritation has been reported rarely. If this occurs, please let us  know.  If you have any trouble using this cream, please call the office. If you have any other questions about this information, please do not hesitate to ask me before you leave the office.  Due to recent changes in healthcare laws, you may see results of your pathology and/or laboratory studies on MyChart before the doctors have had a chance to review them. We understand that in some cases there may be results that are confusing or concerning to you. Please understand that not all results are received at the same time and often the doctors may need to interpret multiple results in order to provide you with the best plan of care or course of treatment. Therefore, we ask that you please give us  2 business days to thoroughly review all your results before contacting the office for clarification. Should we see a critical lab result, you will be contacted sooner.   If You Need  Anything After Your Visit  If you have any questions or concerns for your doctor, please call our main line at [phone] and press option 4 to reach your doctor's medical assistant. If no one answers, please leave a  voicemail as directed and we will return your call as soon as possible. Messages left after 4 pm will be answered the following business day.   You may also send us  a message via MyChart. We typically respond to MyChart messages within 1-2 business days.  For prescription refills, please ask your pharmacy to contact our office. Our fax number is [phone].  If you have an urgent issue when the clinic is closed that cannot wait until the next business day, you can page your doctor at the number below.    Please note that while we do our best to be available for urgent issues outside of office hours, we are not available 24/7.   If you have an urgent issue and are unable to reach us , you may choose to seek medical care at your doctor's office, retail clinic, urgent care center, or emergency room.  If you have a medical emergency, please immediately call 911 or go to the emergency department.  Pager Numbers  - Dr. Hester: [phone]  - Dr. Jackquline: [phone]  - Dr. Claudene: [phone]   - Dr. Raymund: [phone]  In the event of inclement weather, please call our main line at [phone] for an update on the status of any delays or closures.  Dermatology Medication Tips: Please keep the boxes that topical medications come in in order to help keep track of the instructions about where and how to use these. Pharmacies typically print the medication instructions only on the boxes and not directly on the medication tubes.   If your medication is too expensive, please contact our office at [phone] option 4 or send us  a message through MyChart.   We are unable to tell what your co-pay for medications will be in advance as this is different depending on your insurance coverage. However, we may be able to find a substitute medication at lower cost or fill out paperwork to get insurance to cover a needed medication.   If a prior authorization is required to get your medication  covered by your insurance company, please allow us  1-2 business days to complete this process.  Drug prices often vary depending on where the prescription is filled and some pharmacies may offer cheaper prices.  The website www.goodrx.com contains coupons for medications through different pharmacies. The prices here do not account for what the cost may be with help from insurance (it may be cheaper with your insurance), but the website can give you the price if you did not use any insurance.  - You can print the associated coupon and take it with your prescription to the pharmacy.  - You may also stop by our office during regular business hours and pick up a GoodRx coupon card.  - If you need your prescription sent electronically to a different pharmacy, notify our office through [facility] or by phone at [phone] option 4.     Si Usted Necesita Algo Despus de Su Visita  Tambin puede enviarnos un mensaje a travs de Clinical Cytogeneticist. Por lo general respondemos a los mensajes de MyChart en el transcurso de 1 a 2 das hbiles.  Para renovar recetas, por favor pida a su farmacia que se ponga en contacto con nuestra oficina. Randi lakes de fax es el  [phone].  Si tiene un asunto urgente cuando la clnica est cerrada y que no puede esperar hasta el siguiente da hbil, puede llamar/localizar a su doctor(a) al nmero que aparece a continuacin.   Por favor, tenga en cuenta que aunque hacemos todo lo posible para estar disponibles para asuntos urgentes fuera del horario de [place], no estamos disponibles las 24 horas del da, los 7 [address]  [address] de la [place].   Si tiene un problema urgente y no puede comunicarse con nosotros, puede optar por buscar atencin mdica  en el consultorio de su doctor(a), en una clnica privada, en un centro de atencin urgente o en una sala de emergencias.  Si tiene engineer, drilling, por favor llame inmediatamente al 911 o vaya a la sala de  emergencias.  Nmeros de bper  - Dr. Hester: [phone]  - Dra. Jackquline: [phone]  - Dr. Claudene: [phone]  - Dra. Kitts: [phone]  En caso de inclemencias del [place], por favor llame a nuestra lnea principal al [phone] para una actualizacin sobre el estado de cualquier retraso o cierre.  Consejos para la medicacin en dermatologa: Por favor, guarde las cajas en las que vienen los medicamentos de uso tpico para ayudarle a seguir las instrucciones sobre dnde y cmo usarlos. Las farmacias generalmente imprimen las instrucciones del medicamento slo en las cajas y no directamente en los tubos del [place].   Si su medicamento es muy caro, por favor, pngase en contacto con landry rieger llamando al [phone] y presione la opcin 4 o envenos un mensaje a travs de Clinical Cytogeneticist.   No podemos decirle cul ser su copago por los medicamentos por adelantado ya que esto es diferente dependiendo de la cobertura de su seguro. Sin embargo, es posible que podamos encontrar un medicamento sustituto a audiological scientist un formulario para que el seguro cubra el medicamento que se considera necesario.   Si se requiere una autorizacin previa para que su compaa de seguros cubra su medicamento, por favor permtanos de 1 a 2 das hbiles para completar este proceso.  Los precios de los medicamentos varan con frecuencia dependiendo del environmental consultant de dnde se surte la receta y alguna farmacias pueden ofrecer precios ms baratos.  El sitio web www.goodrx.com tiene cupones para medicamentos de health and safety inspector. Los precios aqu no tienen en cuenta lo que podra costar con la ayuda del seguro (puede ser ms barato con su seguro), pero el sitio web puede darle el precio si no utiliz tourist information centre manager.  - Puede imprimir el cupn correspondiente y llevarlo con su receta a la farmacia.  - Tambin puede pasar por nuestra oficina durante el horario de atencin regular y education officer, museum una tarjeta  de cupones de GoodRx.  - Si necesita que su receta se enve electrnicamente a una farmacia diferente, informe a nuestra oficina a travs de MyChart de [place] o por telfono llamando al [phone] y presione la opcin 4.

## 2024-03-23 NOTE — Progress Notes (Signed)
 Follow-Up Visit   Subjective  Ann Jordan is a 68 y.o. female who presents for the following: Actinic keratosis of the face, hands, chest. She treated with 5FU/Calcipotriene about a month ago to the upper lip, hands, and a spot on the chest twice a day x 1 week with a good reaction. The patient has spots, moles and lesions to be evaluated, some may be new or changing.   The following portions of the chart were reviewed this encounter and updated as appropriate: medications, allergies, medical history  Review of Systems:  No other skin or systemic complaints except as noted in HPI or Assessment and Plan.  Objective  Well appearing patient in no apparent distress; mood and affect are within normal limits.  A focused examination was performed of the following areas: Face, hands, chest  Relevant exam findings are noted in the Assessment and Plan.  L thenar hand x 2, L thenar hand x 1 (3) Keratotic macules.  R zygoma x 1 Pink scaly macule.  Assessment & Plan   HYPERTROPHIC ACTINIC KERATOSIS (3) L thenar hand x 2, L thenar hand x 1 (3) Actinic keratoses are precancerous spots that appear secondary to cumulative UV radiation exposure/sun exposure over time. They are chronic with expected duration over 1 year. A portion of actinic keratoses will progress to squamous cell carcinoma of the skin. It is not possible to reliably predict which spots will progress to skin cancer and so treatment is recommended to prevent development of skin cancer.  Recommend daily broad spectrum sunscreen SPF 30+ to sun-exposed areas, reapply every 2 hours as needed.  Recommend staying in the shade or wearing long sleeves, sun glasses (UVA+UVB protection) and wide brim hats (4-inch brim around the entire circumference of the hat). Call for new or changing lesions. Destruction of lesion - L thenar hand x 2, L thenar hand x 1 (3)  Destruction method: cryotherapy   Informed consent: discussed and consent  obtained   Lesion destroyed using liquid nitrogen: Yes   Region frozen until ice ball extended beyond lesion: Yes   Outcome: patient tolerated procedure well with no complications   Post-procedure details: wound care instructions given   Additional details:  Prior to procedure, discussed risks of blister formation, small wound, skin dyspigmentation, or rare scar following cryotherapy. Recommend Vaseline ointment to treated areas while healing.   AK (ACTINIC KERATOSIS) R zygoma x 1 Actinic keratoses are precancerous spots that appear secondary to cumulative UV radiation exposure/sun exposure over time. They are chronic with expected duration over 1 year. A portion of actinic keratoses will progress to squamous cell carcinoma of the skin. It is not possible to reliably predict which spots will progress to skin cancer and so treatment is recommended to prevent development of skin cancer.  Recommend daily broad spectrum sunscreen SPF 30+ to sun-exposed areas, reapply every 2 hours as needed.  Recommend staying in the shade or wearing long sleeves, sun glasses (UVA+UVB protection) and wide brim hats (4-inch brim around the entire circumference of the hat). Call for new or changing lesions. Destruction of lesion - R zygoma x 1  Destruction method: cryotherapy   Informed consent: discussed and consent obtained   Lesion destroyed using liquid nitrogen: Yes   Region frozen until ice ball extended beyond lesion: Yes   Outcome: patient tolerated procedure well with no complications   Post-procedure details: wound care instructions given   Additional details:  Prior to procedure, discussed risks of blister formation, small wound,  skin dyspigmentation, or rare scar following cryotherapy. Recommend Vaseline ointment to treated areas while healing.    ACTINIC DAMAGE WITH PRECANCEROUS ACTINIC KERATOSES Counseling for Topical Chemotherapy Management: Patient exhibits: - Severe, confluent actinic changes  with pre-cancerous actinic keratoses that is secondary to cumulative UV radiation exposure over time - Condition that is severe; chronic, not at goal. - diffuse scaly erythematous macules and papules with underlying dyspigmentation - Discussed Prescription Field Treatment topical Chemotherapy for Severe, Chronic Confluent Actinic Changes with Pre-Cancerous Actinic Keratoses Field treatment involves treatment of an entire area of skin that has confluent Actinic Changes (Sun/ Ultraviolet light damage) and PreCancerous Actinic Keratoses by method of PhotoDynamic Therapy (PDT) and/or prescription Topical Chemotherapy agents such as 5-fluorouracil , 5-fluorouracil /calcipotriene, and/or imiquimod.  The purpose is to decrease the number of clinically evident and subclinical PreCancerous lesions to prevent progression to development of skin cancer by chemically destroying early precancer changes that may or may not be visible.  It has been shown to reduce the risk of developing skin cancer in the treated area. As a result of treatment, redness, scaling, crusting, and open sores may occur during treatment course. One or more than one of these methods may be used and may have to be used several times to control, suppress and eliminate the PreCancerous changes. Discussed treatment course, expected reaction, and possible side effects. - Recommend daily broad spectrum sunscreen SPF 30+ to sun-exposed areas, reapply every 2 hours as needed.  - Staying in the shade or wearing long sleeves, sun glasses (UVA+UVB protection) and wide brim hats (4-inch brim around the entire circumference of the hat) are also recommended. - Call for new or changing lesions.  Good results with 5FU/Calcipotriene cream to upper lip and hands. She has also had PDT treatment in the past to face. Patient will treat chest BID x 1 week before next visit.   Reviewed course of treatment and expected reaction.  Patient advised to expect inflammation  and crusting and advised that erosions are possible.  Patient advised to be diligent with sun protection during and after treatment. Handout with details of how to apply medication and what to expect provided. Counseled to keep medication out of reach of children and pets.   Return in about 3 months (around 06/21/2024) for TBSE, Hx BCC.  IAndrea Kerns, CMA, am acting as scribe for Rexene Rattler, MD .   Documentation: I have reviewed the above documentation for accuracy and completeness, and I agree with the above.  Rexene Rattler, MD

## 2024-04-28 ENCOUNTER — Other Ambulatory Visit: Payer: Self-pay | Admitting: Internal Medicine

## 2024-04-28 DIAGNOSIS — R918 Other nonspecific abnormal finding of lung field: Secondary | ICD-10-CM

## 2024-04-28 DIAGNOSIS — E7849 Other hyperlipidemia: Secondary | ICD-10-CM

## 2024-05-05 ENCOUNTER — Ambulatory Visit
Admission: RE | Admit: 2024-05-05 | Discharge: 2024-05-05 | Disposition: A | Discharge status: Home / Self Care | Source: Ambulatory Visit | Attending: Internal Medicine | Admitting: Internal Medicine

## 2024-05-05 DIAGNOSIS — E7849 Other hyperlipidemia: Secondary | ICD-10-CM | POA: Diagnosis present

## 2024-05-05 DIAGNOSIS — R918 Other nonspecific abnormal finding of lung field: Secondary | ICD-10-CM | POA: Insufficient documentation

## 2024-06-27 ENCOUNTER — Encounter: Admitting: Dermatology
